# Patient Record
Sex: Female | Born: 1965 | Race: White | Hispanic: No | Marital: Married | Smoking: Current every day smoker
Health system: Southern US, Community
[De-identification: ages and names within clinical notes are randomized; demographics above are authoritative.]

## PROBLEM LIST (undated history)

## (undated) DIAGNOSIS — N83202 Unspecified ovarian cyst, left side: Secondary | ICD-10-CM

## (undated) DIAGNOSIS — K219 Gastro-esophageal reflux disease without esophagitis: Secondary | ICD-10-CM

## (undated) DIAGNOSIS — I7300 Raynaud's syndrome without gangrene: Secondary | ICD-10-CM

## (undated) DIAGNOSIS — E7800 Pure hypercholesterolemia, unspecified: Secondary | ICD-10-CM

## (undated) DIAGNOSIS — F419 Anxiety disorder, unspecified: Secondary | ICD-10-CM

## (undated) DIAGNOSIS — L4050 Arthropathic psoriasis, unspecified: Secondary | ICD-10-CM

## (undated) DIAGNOSIS — D0472 Carcinoma in situ of skin of left lower limb, including hip: Secondary | ICD-10-CM

## (undated) DIAGNOSIS — E559 Vitamin D deficiency, unspecified: Secondary | ICD-10-CM

## (undated) DIAGNOSIS — G5600 Carpal tunnel syndrome, unspecified upper limb: Secondary | ICD-10-CM

## (undated) DIAGNOSIS — D219 Benign neoplasm of connective and other soft tissue, unspecified: Secondary | ICD-10-CM

## (undated) HISTORY — PX: CYSTECTOMY: SUR359

## (undated) HISTORY — DX: Arthropathic psoriasis, unspecified: L40.50

## (undated) HISTORY — DX: Raynaud's syndrome without gangrene: I73.00

## (undated) HISTORY — PX: TONSILLECTOMY: SUR1361

## (undated) HISTORY — PX: ANKLE FRACTURE SURGERY: SHX122

## (undated) HISTORY — DX: Vitamin D deficiency, unspecified: E55.9

## (undated) HISTORY — PX: APPENDECTOMY: SHX54

## (undated) HISTORY — DX: Carcinoma in situ of skin of left lower limb, including hip: D04.72

---

## 1999-02-20 ENCOUNTER — Emergency Department (HOSPITAL_COMMUNITY): Admission: EM | Admit: 1999-02-20 | Discharge: 1999-02-20 | Payer: Self-pay | Admitting: Emergency Medicine

## 1999-03-11 ENCOUNTER — Encounter: Admission: RE | Admit: 1999-03-11 | Discharge: 1999-04-14 | Payer: Self-pay | Admitting: Orthopedic Surgery

## 2001-07-16 ENCOUNTER — Ambulatory Visit (HOSPITAL_COMMUNITY): Admission: RE | Admit: 2001-07-16 | Discharge: 2001-07-16 | Payer: Self-pay | Admitting: Neurosurgery

## 2001-07-16 ENCOUNTER — Encounter: Payer: Self-pay | Admitting: Neurosurgery

## 2003-01-17 ENCOUNTER — Encounter: Payer: Self-pay | Admitting: Chiropractic Medicine

## 2003-01-17 ENCOUNTER — Encounter: Admission: RE | Admit: 2003-01-17 | Discharge: 2003-01-17 | Payer: Self-pay | Admitting: Chiropractic Medicine

## 2003-07-21 ENCOUNTER — Encounter: Admission: RE | Admit: 2003-07-21 | Discharge: 2003-07-21 | Payer: Self-pay | Admitting: Chiropractic Medicine

## 2003-11-18 ENCOUNTER — Other Ambulatory Visit: Payer: Self-pay

## 2005-07-24 ENCOUNTER — Encounter: Admission: RE | Admit: 2005-07-24 | Discharge: 2005-07-24 | Payer: Self-pay | Admitting: Neurology

## 2005-07-25 ENCOUNTER — Encounter: Admission: RE | Admit: 2005-07-25 | Discharge: 2005-07-25 | Payer: Self-pay | Admitting: Neurology

## 2006-12-08 ENCOUNTER — Ambulatory Visit: Payer: Self-pay | Admitting: Urology

## 2010-03-05 ENCOUNTER — Emergency Department: Payer: Self-pay | Admitting: Internal Medicine

## 2010-05-23 HISTORY — PX: LAPAROSCOPIC HYSTERECTOMY: SHX1926

## 2011-03-02 ENCOUNTER — Other Ambulatory Visit: Payer: Self-pay | Admitting: Obstetrics and Gynecology

## 2011-03-02 ENCOUNTER — Inpatient Hospital Stay (HOSPITAL_COMMUNITY)
Admission: AD | Admit: 2011-03-02 | Discharge: 2011-03-02 | Disposition: A | Discharge status: Home / Self Care | Payer: BC Managed Care – PPO | Source: Ambulatory Visit | Attending: Obstetrics and Gynecology | Admitting: Obstetrics and Gynecology

## 2011-03-02 ENCOUNTER — Encounter (HOSPITAL_COMMUNITY): Payer: Self-pay

## 2011-03-02 DIAGNOSIS — N83209 Unspecified ovarian cyst, unspecified side: Secondary | ICD-10-CM | POA: Insufficient documentation

## 2011-03-02 DIAGNOSIS — D259 Leiomyoma of uterus, unspecified: Secondary | ICD-10-CM | POA: Insufficient documentation

## 2011-03-02 DIAGNOSIS — R109 Unspecified abdominal pain: Secondary | ICD-10-CM | POA: Insufficient documentation

## 2011-03-02 DIAGNOSIS — N926 Irregular menstruation, unspecified: Secondary | ICD-10-CM | POA: Insufficient documentation

## 2011-03-02 HISTORY — DX: Anxiety disorder, unspecified: F41.9

## 2011-03-02 HISTORY — DX: Benign neoplasm of connective and other soft tissue, unspecified: D21.9

## 2011-03-02 HISTORY — DX: Unspecified ovarian cyst, left side: N83.202

## 2011-03-02 LAB — URINALYSIS, ROUTINE W REFLEX MICROSCOPIC
Bilirubin Urine: NEGATIVE
Glucose, UA: NEGATIVE mg/dL
Ketones, ur: NEGATIVE mg/dL
Leukocytes, UA: NEGATIVE
Nitrite: NEGATIVE
Protein, ur: NEGATIVE mg/dL
Specific Gravity, Urine: 1.01 (ref 1.005–1.030)
Urobilinogen, UA: 0.2 mg/dL (ref 0.0–1.0)
pH: 6 (ref 5.0–8.0)

## 2011-03-02 LAB — URINE MICROSCOPIC-ADD ON

## 2011-03-02 MED ORDER — HYDROMORPHONE HCL 2 MG PO TABS
2.0000 mg | ORAL_TABLET | ORAL | Status: DC | PRN
  Administered 2011-03-02: 2 mg via ORAL
  Filled 2011-03-02: qty 1

## 2011-03-02 MED ORDER — HYDROMORPHONE HCL 2 MG PO TABS: 2.0000 mg | ORAL_TABLET | ORAL | Status: AC | PRN

## 2011-03-02 NOTE — Patient Instructions (Addendum)
   Your procedure is scheduled on: Thursday, Oct. 25, 2012 at 1:15pm  Enter through the Main Entrance of [facility] at: 11:45am Pick up the phone at the desk and dial [phone] and inform us of your arrival.  Please call this number if you have any problems the morning of surgery: [phone]  Remember: Do not eat food after midnight: Wednesday Do not drink clear liquids after: 9:00am Thursday Take these medicines the morning of surgery with a SIP OF WATER:zoloft, bring inhaler  Do not wear jewelry, make-up, or FINGER nail polish Do not wear lotions, powders, or perfumes.  . Do not shave 48 hours prior to surgery. Do not bring valuables to the hospital.  Leave suitcase in the car. After Surgery it may be brought to your room. For patients being admitted to the hospital, checkout time is 11:00am the day of discharge.   Remember to use your hibiclens as instructed.Please shower with 1/2 bottle the evening before your surgery and the other 1/2 bottle the morning of surgery.

## 2011-03-02 NOTE — Progress Notes (Signed)
Pt states she is scheduled for a LAVH by Dr. Cherly Hensen on 10-25. Has been having bleeding for 14 days and now having a lot of cramping.

## 2011-03-02 NOTE — Progress Notes (Signed)
Pt states lower abd pain bilaterally, has been hurting for weeks, hysterectomy scheduled for 03/17/2011. Awoke last pm with "blast" of clots, long and narrow, dark blood, small amt bleeding, vaginal d/c clear-mucousy, non-odorous.

## 2011-03-02 NOTE — ED Notes (Signed)
No adverse effect from dilaudid

## 2011-03-02 NOTE — ED Provider Notes (Signed)
History     Chief Complaint  Patient presents with  . Abdominal Pain   HPI  Scheduled for Davinci hysterectomy end of the month. States pain is miserable since last night - Vicodin ineffective for pain management. Reports heavy bleeding last night passing large clots - still bleeding this am. Pain constant all night - took 1 Vicodin last night without relief. Wants to see if surgery can be done sooner than end of the month - Dr Cherly Hensen aware of this request / will move up surgery if OR time becomes available.   Past Medical History  Diagnosis Date  . Anxiety   . Ovarian cyst, left   . Fibroids   . Asthma     Past Surgical History  Procedure Date  . Tonsillectomy   . Cystectomy   . Ankle fracture surgery   . Appendectomy     No family history on file.  History  Substance Use Topics  . Smoking status: Current Everyday Smoker -- 0.2 packs/day  . Smokeless tobacco: Never Used  . Alcohol Use: No    Allergies:  Allergies  Allergen Reactions  . Codeine Other (See Comments)    Reaction: causes accelerated heart rate   . Demerol Other (See Comments)    Reaction: causes accelerated heart rate     Prescriptions prior to admission  Medication Sig Dispense Refill  . aspirin 81 MG tablet Take 81 mg by mouth daily.        Marland Kitchen ezetimibe-simvastatin (VYTORIN) 10-40 MG per tablet Take 1 tablet by mouth daily.        . sertraline (ZOLOFT) 100 MG tablet Take 100 mg by mouth daily.        Marland Kitchen zolpidem (AMBIEN) 10 MG tablet Take 10 mg by mouth at bedtime.         ROS Physical Exam   Blood pressure 125/70, pulse 113, temperature 97.7 F (36.5 C), temperature source Oral, resp. rate 20, height 5\' 6"  (1.676 m), weight 84.823 kg (187 lb), last menstrual period 02/21/2011, SpO2 97.00%.  Physical Exam  NAD , talking with spouse at bedside Lungs - clear Heart - RRR Abdomen - soft / upper quadrants non-tender / uterus tender with palpation / no guarding or rebound Ext - no  edema  Spec exam - scant tan discharge / cervix closed no active bleeding visible Bimanual - no bladder tenderness / uterus tender and enlarged / adnexa full but non-tender  MAU Course  Procedures  Assessment and Plan  Abdominal pain and irregular bleeding - fibroids and ovarian cyst (Left) Scheduled for hysterectomy end of the month  Stable status - no evidence of heavy bleeding RX dilaudid for pain Keep apt with Dr Cherly Hensen in office next week - call to be seen in office by Dr Cherly Hensen if new pain medication ineffective in pain control. Hydrate well with fibroid uterus  Madalena Kesecker 03/02/2011, 10:54 AM

## 2011-03-02 NOTE — ED Notes (Addendum)
Dr. Cherly Hensen notified pt in MAU for pain and bleeding, hx of abnormal bleeding/pain, pt scheduled for hysterectomy 03/10/2011, Dr. Cherly Hensen to send CNM to assess pt in MAU. No orders obtained. Written erroneously under JLowe, RN.

## 2011-03-02 NOTE — ED Provider Notes (Signed)
Patricia Shields is a 45 y.o. female who presents to MAU for heavy bleeding and lower abdominal pain. She has been evaluated in the office by Dr. Cherly Hensen and is scheduled for hysterectomy. Last night the bleeding became heavy with clots, this am bleeding is less but pain continues in lower abdomen. The history was provided by the patient.  VS - T 97.7, P 113, R 20, O2Sat  97% on RA. Patient is stable to await private MD for further evaluation.  Thornton, NP 03/02/11 1031

## 2011-03-03 MED ORDER — ACETAMINOPHEN 10 MG/ML IV SOLN: 1000.0000 mg | Freq: Four times a day (QID) | INTRAVENOUS | Status: AC

## 2011-03-08 ENCOUNTER — Encounter (HOSPITAL_COMMUNITY)
Admission: RE | Admit: 2011-03-08 | Discharge: 2011-03-08 | Disposition: A | Discharge status: Home / Self Care | Payer: BC Managed Care – PPO | Source: Ambulatory Visit | Attending: Obstetrics and Gynecology | Admitting: Obstetrics and Gynecology

## 2011-03-08 ENCOUNTER — Encounter (HOSPITAL_COMMUNITY): Payer: Self-pay

## 2011-03-08 HISTORY — DX: Pure hypercholesterolemia, unspecified: E78.00

## 2011-03-08 HISTORY — DX: Gastro-esophageal reflux disease without esophagitis: K21.9

## 2011-03-08 HISTORY — DX: Carpal tunnel syndrome, unspecified upper limb: G56.00

## 2011-03-08 LAB — BASIC METABOLIC PANEL
BUN: 7 mg/dL (ref 6–23)
CO2: 28 mEq/L (ref 19–32)
Calcium: 8.9 mg/dL (ref 8.4–10.5)
Chloride: 103 mEq/L (ref 96–112)
Creatinine, Ser: <0.47 mg/dL — ABNORMAL LOW (ref 0.50–1.10)
Glucose, Bld: 98 mg/dL (ref 70–99)
Potassium: 3.6 mEq/L (ref 3.5–5.1)
Sodium: 137 mEq/L (ref 135–145)

## 2011-03-08 LAB — SURGICAL PCR SCREEN
MRSA, PCR: NEGATIVE
Staphylococcus aureus: NEGATIVE

## 2011-03-08 LAB — CBC
HCT: 34.8 % — ABNORMAL LOW (ref 36.0–46.0)
Hemoglobin: 11.3 g/dL — ABNORMAL LOW (ref 12.0–15.0)
MCH: 30.6 pg (ref 26.0–34.0)
MCHC: 32.5 g/dL (ref 30.0–36.0)
MCV: 94.3 fL (ref 78.0–100.0)
Platelets: 267 10*3/uL (ref 150–400)
RBC: 3.69 MIL/uL — ABNORMAL LOW (ref 3.87–5.11)
RDW: 13.2 % (ref 11.5–15.5)
WBC: 6.5 10*3/uL (ref 4.0–10.5)

## 2011-03-16 MED ORDER — CEFAZOLIN SODIUM-DEXTROSE 2-3 GM-% IV SOLR
2.0000 g | INTRAVENOUS | Status: AC
  Administered 2011-03-17: 2 g via INTRAVENOUS
  Filled 2011-03-16: qty 50

## 2011-03-17 ENCOUNTER — Encounter (HOSPITAL_COMMUNITY): Payer: Self-pay | Admitting: Anesthesiology

## 2011-03-17 ENCOUNTER — Ambulatory Visit (HOSPITAL_COMMUNITY): Payer: BC Managed Care – PPO | Admitting: Anesthesiology

## 2011-03-17 ENCOUNTER — Encounter (HOSPITAL_COMMUNITY)
Admission: RE | Disposition: A | Discharge status: Home / Self Care | Payer: Self-pay | Source: Ambulatory Visit | Attending: Obstetrics and Gynecology

## 2011-03-17 ENCOUNTER — Encounter (HOSPITAL_COMMUNITY): Payer: Self-pay | Admitting: *Deleted

## 2011-03-17 ENCOUNTER — Ambulatory Visit (HOSPITAL_COMMUNITY)
Admission: RE | Admit: 2011-03-17 | Discharge: 2011-03-18 | Disposition: A | Discharge status: Home / Self Care | Payer: BC Managed Care – PPO | Source: Ambulatory Visit | Attending: Obstetrics and Gynecology | Admitting: Obstetrics and Gynecology

## 2011-03-17 DIAGNOSIS — N801 Endometriosis of ovary: Secondary | ICD-10-CM | POA: Insufficient documentation

## 2011-03-17 DIAGNOSIS — N803 Endometriosis of pelvic peritoneum, unspecified: Secondary | ICD-10-CM | POA: Insufficient documentation

## 2011-03-17 DIAGNOSIS — N949 Unspecified condition associated with female genital organs and menstrual cycle: Secondary | ICD-10-CM | POA: Insufficient documentation

## 2011-03-17 DIAGNOSIS — Z90721 Acquired absence of ovaries, unilateral: Secondary | ICD-10-CM

## 2011-03-17 DIAGNOSIS — N938 Other specified abnormal uterine and vaginal bleeding: Secondary | ICD-10-CM | POA: Insufficient documentation

## 2011-03-17 DIAGNOSIS — Z90710 Acquired absence of both cervix and uterus: Secondary | ICD-10-CM

## 2011-03-17 DIAGNOSIS — N83209 Unspecified ovarian cyst, unspecified side: Secondary | ICD-10-CM | POA: Insufficient documentation

## 2011-03-17 DIAGNOSIS — Z01812 Encounter for preprocedural laboratory examination: Secondary | ICD-10-CM | POA: Insufficient documentation

## 2011-03-17 DIAGNOSIS — N80109 Endometriosis of ovary, unspecified side, unspecified depth: Secondary | ICD-10-CM | POA: Insufficient documentation

## 2011-03-17 DIAGNOSIS — D251 Intramural leiomyoma of uterus: Secondary | ICD-10-CM | POA: Insufficient documentation

## 2011-03-17 DIAGNOSIS — Z01818 Encounter for other preprocedural examination: Secondary | ICD-10-CM | POA: Insufficient documentation

## 2011-03-17 HISTORY — PX: SALPINGOOPHORECTOMY: SHX82

## 2011-03-17 LAB — PREGNANCY, URINE: Preg Test, Ur: NEGATIVE

## 2011-03-17 SURGERY — ROBOTIC ASSISTED TOTAL HYSTERECTOMY WITH BILATERAL SALPINGO OOPHORECTOMY
Anesthesia: General | Wound class: Clean Contaminated

## 2011-03-17 MED ORDER — OXYCODONE-ACETAMINOPHEN 5-325 MG PO TABS
1.0000 | ORAL_TABLET | ORAL | Status: DC | PRN
  Administered 2011-03-18: 2 via ORAL
  Filled 2011-03-17: qty 2

## 2011-03-17 MED ORDER — DIPHENHYDRAMINE HCL 50 MG/ML IJ SOLN: 12.5000 mg | Freq: Four times a day (QID) | INTRAMUSCULAR | Status: DC | PRN

## 2011-03-17 MED ORDER — SUCCINYLCHOLINE CHLORIDE 20 MG/ML IJ SOLN
INTRAMUSCULAR | Status: AC
  Filled 2011-03-17: qty 1

## 2011-03-17 MED ORDER — ROCURONIUM BROMIDE 50 MG/5ML IV SOLN
INTRAVENOUS | Status: AC
  Filled 2011-03-17: qty 2

## 2011-03-17 MED ORDER — SODIUM CHLORIDE 0.9 % IJ SOLN: 9.0000 mL | INTRAMUSCULAR | Status: DC | PRN

## 2011-03-17 MED ORDER — LACTATED RINGERS IR SOLN
Status: DC | PRN
  Administered 2011-03-17: 1

## 2011-03-17 MED ORDER — BISACODYL 10 MG RE SUPP: 10.0000 mg | Freq: Every day | RECTAL | Status: DC | PRN

## 2011-03-17 MED ORDER — ONDANSETRON HCL 4 MG/2ML IJ SOLN: 4.0000 mg | Freq: Four times a day (QID) | INTRAMUSCULAR | Status: DC | PRN

## 2011-03-17 MED ORDER — CEFAZOLIN SODIUM 1-5 GM-% IV SOLN
1.0000 g | Freq: Three times a day (TID) | INTRAVENOUS | Status: AC
  Administered 2011-03-17 – 2011-03-18 (×2): 1 g via INTRAVENOUS
  Filled 2011-03-17 (×2): qty 50

## 2011-03-17 MED ORDER — DEXAMETHASONE SODIUM PHOSPHATE 10 MG/ML IJ SOLN
INTRAMUSCULAR | Status: DC | PRN
  Administered 2011-03-17: 10 mg via INTRAVENOUS

## 2011-03-17 MED ORDER — PANTOPRAZOLE SODIUM 40 MG PO TBEC
40.0000 mg | DELAYED_RELEASE_TABLET | Freq: Every day | ORAL | Status: DC
  Filled 2011-03-17: qty 1

## 2011-03-17 MED ORDER — ZOLPIDEM TARTRATE 5 MG PO TABS
5.0000 mg | ORAL_TABLET | Freq: Every evening | ORAL | Status: DC | PRN
  Administered 2011-03-17: 10 mg via ORAL
  Filled 2011-03-17: qty 2

## 2011-03-17 MED ORDER — HYDROMORPHONE HCL 1 MG/ML IJ SOLN
INTRAMUSCULAR | Status: DC | PRN
  Administered 2011-03-17: 0.5 mg via INTRAVENOUS
  Administered 2011-03-17: 1 mg via INTRAVENOUS
  Administered 2011-03-17: 0.5 mg via INTRAVENOUS

## 2011-03-17 MED ORDER — BUPIVACAINE HCL (PF) 0.25 % IJ SOLN
INTRAMUSCULAR | Status: DC | PRN
  Administered 2011-03-17: 7 mL

## 2011-03-17 MED ORDER — LIDOCAINE HCL (CARDIAC) 20 MG/ML IV SOLN
INTRAVENOUS | Status: DC | PRN
  Administered 2011-03-17: 80 mg via INTRAVENOUS

## 2011-03-17 MED ORDER — MIDAZOLAM HCL 2 MG/2ML IJ SOLN
INTRAMUSCULAR | Status: AC
  Filled 2011-03-17: qty 2

## 2011-03-17 MED ORDER — HYDROMORPHONE HCL 1 MG/ML IJ SOLN
INTRAMUSCULAR | Status: AC
  Filled 2011-03-17: qty 1

## 2011-03-17 MED ORDER — DEXAMETHASONE SODIUM PHOSPHATE 10 MG/ML IJ SOLN
INTRAMUSCULAR | Status: AC
  Filled 2011-03-17: qty 1

## 2011-03-17 MED ORDER — SUCCINYLCHOLINE CHLORIDE 20 MG/ML IJ SOLN
INTRAMUSCULAR | Status: DC | PRN
  Administered 2011-03-17: 100 mg via INTRAVENOUS

## 2011-03-17 MED ORDER — HYDROMORPHONE HCL 1 MG/ML IJ SOLN
INTRAMUSCULAR | Status: AC
  Administered 2011-03-17: 0.25 mg via INTRAVENOUS
  Filled 2011-03-17: qty 1

## 2011-03-17 MED ORDER — SENNOSIDES-DOCUSATE SODIUM 8.6-50 MG PO TABS: 2.0000 | ORAL_TABLET | Freq: Every day | ORAL | Status: DC | PRN

## 2011-03-17 MED ORDER — GLYCOPYRROLATE 0.2 MG/ML IJ SOLN
INTRAMUSCULAR | Status: DC | PRN
  Administered 2011-03-17: .8 mg via INTRAVENOUS

## 2011-03-17 MED ORDER — MIDAZOLAM HCL 5 MG/5ML IJ SOLN
INTRAMUSCULAR | Status: DC | PRN
  Administered 2011-03-17 (×2): 1 mg via INTRAVENOUS

## 2011-03-17 MED ORDER — EZETIMIBE 10 MG PO TABS
10.0000 mg | ORAL_TABLET | Freq: Every day | ORAL | Status: DC
  Filled 2011-03-17: qty 1

## 2011-03-17 MED ORDER — DOCUSATE SODIUM 100 MG PO CAPS: 100.0000 mg | ORAL_CAPSULE | Freq: Every day | ORAL | Status: DC

## 2011-03-17 MED ORDER — DEXTROSE IN LACTATED RINGERS 5 % IV SOLN
INTRAVENOUS | Status: DC
  Administered 2011-03-17 – 2011-03-18 (×2): via INTRAVENOUS

## 2011-03-17 MED ORDER — PROPOFOL 10 MG/ML IV EMUL
INTRAVENOUS | Status: DC | PRN
  Administered 2011-03-17: 150 mg via INTRAVENOUS
  Administered 2011-03-17: 30 mg via INTRAVENOUS
  Administered 2011-03-17: 50 mg via INTRAVENOUS

## 2011-03-17 MED ORDER — SIMVASTATIN 40 MG PO TABS
40.0000 mg | ORAL_TABLET | Freq: Every day | ORAL | Status: DC
  Filled 2011-03-17: qty 1

## 2011-03-17 MED ORDER — FENTANYL CITRATE 0.05 MG/ML IJ SOLN
INTRAMUSCULAR | Status: AC
  Filled 2011-03-17: qty 5

## 2011-03-17 MED ORDER — LIDOCAINE HCL (CARDIAC) 20 MG/ML IV SOLN
INTRAVENOUS | Status: AC
  Filled 2011-03-17: qty 5

## 2011-03-17 MED ORDER — SERTRALINE HCL 100 MG PO TABS
100.0000 mg | ORAL_TABLET | Freq: Every day | ORAL | Status: DC
  Filled 2011-03-17: qty 1

## 2011-03-17 MED ORDER — ACETAMINOPHEN 10 MG/ML IV SOLN
1000.0000 mg | Freq: Four times a day (QID) | INTRAVENOUS | Status: DC
  Filled 2011-03-17 (×4): qty 100

## 2011-03-17 MED ORDER — KETOROLAC TROMETHAMINE 30 MG/ML IJ SOLN: 30.0000 mg | Freq: Four times a day (QID) | INTRAMUSCULAR | Status: DC

## 2011-03-17 MED ORDER — ALUM & MAG HYDROXIDE-SIMETH 200-200-20 MG/5ML PO SUSP: 30.0000 mL | ORAL | Status: DC | PRN

## 2011-03-17 MED ORDER — ONDANSETRON HCL 4 MG/2ML IJ SOLN
INTRAMUSCULAR | Status: DC | PRN
  Administered 2011-03-17: 4 mg via INTRAVENOUS

## 2011-03-17 MED ORDER — LACTATED RINGERS IV SOLN
INTRAVENOUS | Status: DC
  Administered 2011-03-17 (×2): via INTRAVENOUS

## 2011-03-17 MED ORDER — HYDROMORPHONE 0.3 MG/ML IV SOLN
INTRAVENOUS | Status: AC
  Filled 2011-03-17: qty 25

## 2011-03-17 MED ORDER — NEOSTIGMINE METHYLSULFATE 1 MG/ML IJ SOLN
INTRAMUSCULAR | Status: AC
  Filled 2011-03-17: qty 10

## 2011-03-17 MED ORDER — FENTANYL CITRATE 0.05 MG/ML IJ SOLN
INTRAMUSCULAR | Status: DC | PRN
  Administered 2011-03-17: 50 ug via INTRAVENOUS
  Administered 2011-03-17: 100 ug via INTRAVENOUS
  Administered 2011-03-17 (×2): 50 ug via INTRAVENOUS

## 2011-03-17 MED ORDER — IBUPROFEN 800 MG PO TABS: 800.0000 mg | ORAL_TABLET | Freq: Three times a day (TID) | ORAL | Status: DC | PRN

## 2011-03-17 MED ORDER — KETOROLAC TROMETHAMINE 30 MG/ML IJ SOLN
30.0000 mg | Freq: Four times a day (QID) | INTRAMUSCULAR | Status: DC
  Administered 2011-03-17 – 2011-03-18 (×2): 30 mg via INTRAVENOUS
  Filled 2011-03-17 (×2): qty 1

## 2011-03-17 MED ORDER — ONDANSETRON HCL 4 MG PO TABS: 4.0000 mg | ORAL_TABLET | Freq: Four times a day (QID) | ORAL | Status: DC | PRN

## 2011-03-17 MED ORDER — NEOSTIGMINE METHYLSULFATE 1 MG/ML IJ SOLN
INTRAMUSCULAR | Status: DC | PRN
  Administered 2011-03-17: 4 mg via INTRAVENOUS

## 2011-03-17 MED ORDER — ROCURONIUM BROMIDE 100 MG/10ML IV SOLN
INTRAVENOUS | Status: DC | PRN
  Administered 2011-03-17: 40 mg via INTRAVENOUS
  Administered 2011-03-17: 10 mg via INTRAVENOUS
  Administered 2011-03-17 (×2): 20 mg via INTRAVENOUS

## 2011-03-17 MED ORDER — HYDROMORPHONE HCL 1 MG/ML IJ SOLN
0.2000 mg | INTRAMUSCULAR | Status: DC | PRN
Start: 2011-03-17 — End: 2011-03-18

## 2011-03-17 MED ORDER — HYDROMORPHONE HCL 1 MG/ML IJ SOLN
0.2500 mg | INTRAMUSCULAR | Status: DC | PRN
  Administered 2011-03-17 (×2): 0.25 mg via INTRAVENOUS

## 2011-03-17 MED ORDER — DEXAMETHASONE SODIUM PHOSPHATE 4 MG/ML IJ SOLN: 8.0000 mg | Freq: Once | INTRAMUSCULAR | Status: DC | PRN

## 2011-03-17 MED ORDER — PROPOFOL 10 MG/ML IV EMUL
INTRAVENOUS | Status: AC
  Filled 2011-03-17: qty 20

## 2011-03-17 MED ORDER — EZETIMIBE-SIMVASTATIN 10-40 MG PO TABS: 1.0000 | ORAL_TABLET | Freq: Every day | ORAL | Status: DC

## 2011-03-17 MED ORDER — GLYCOPYRROLATE 0.2 MG/ML IJ SOLN
INTRAMUSCULAR | Status: AC
  Filled 2011-03-17: qty 3

## 2011-03-17 MED ORDER — KETOROLAC TROMETHAMINE 30 MG/ML IJ SOLN: 15.0000 mg | Freq: Once | INTRAMUSCULAR | Status: DC | PRN

## 2011-03-17 MED ORDER — MENTHOL 3 MG MT LOZG
1.0000 | LOZENGE | OROMUCOSAL | Status: DC | PRN
  Administered 2011-03-17: 3 mg via ORAL
  Filled 2011-03-17: qty 9

## 2011-03-17 MED ORDER — HYDROMORPHONE 0.3 MG/ML IV SOLN
INTRAVENOUS | Status: DC
  Administered 2011-03-17: 22:00:00 via INTRAVENOUS
  Administered 2011-03-18: 0.19 mg via INTRAVENOUS
  Administered 2011-03-18: 0.4 mg via INTRAVENOUS
  Administered 2011-03-18: 0.999 mg via INTRAVENOUS

## 2011-03-17 MED ORDER — NALOXONE HCL 0.4 MG/ML IJ SOLN: 0.4000 mg | INTRAMUSCULAR | Status: DC | PRN

## 2011-03-17 MED ORDER — DIPHENHYDRAMINE HCL 12.5 MG/5ML PO ELIX: 12.5000 mg | ORAL_SOLUTION | Freq: Four times a day (QID) | ORAL | Status: DC | PRN

## 2011-03-17 MED ORDER — SIMETHICONE 80 MG PO CHEW: 80.0000 mg | CHEWABLE_TABLET | Freq: Four times a day (QID) | ORAL | Status: DC | PRN

## 2011-03-17 MED ORDER — ACETAMINOPHEN 10 MG/ML IV SOLN
INTRAVENOUS | Status: DC | PRN
  Administered 2011-03-17: 1000 mg via INTRAVENOUS

## 2011-03-17 MED ORDER — KETOROLAC TROMETHAMINE 30 MG/ML IJ SOLN
INTRAMUSCULAR | Status: DC | PRN
  Administered 2011-03-17: 30 mg via INTRAVENOUS

## 2011-03-17 MED ORDER — ONDANSETRON HCL 4 MG/2ML IJ SOLN
INTRAMUSCULAR | Status: AC
  Filled 2011-03-17: qty 2

## 2011-03-17 SURGICAL SUPPLY — 57 items
BAG URINE DRAINAGE (UROLOGICAL SUPPLIES) ×3 IMPLANT
BARRIER ADHS 3X4 INTERCEED (GAUZE/BANDAGES/DRESSINGS) ×3 IMPLANT
BLADE LAP MORCELLATOR 15X9.5 (ELECTROSURGICAL) ×3 IMPLANT
BLADELESS LONG 8MM (BLADE) ×3 IMPLANT
CABLE HIGH FREQUENCY MONO STRZ (ELECTRODE) ×3 IMPLANT
CATH FOLEY 3WAY  5CC 16FR (CATHETERS) ×1
CATH FOLEY 3WAY 5CC 16FR (CATHETERS) ×2 IMPLANT
CHLORAPREP W/TINT 26ML (MISCELLANEOUS) ×3 IMPLANT
CONT PATH 16OZ SNAP LID 3702 (MISCELLANEOUS) ×3 IMPLANT
COVER MAYO STAND STRL (DRAPES) ×3 IMPLANT
COVER TABLE BACK 60X90 (DRAPES) ×6 IMPLANT
COVER TIP SHEARS 8 DVNC (MISCELLANEOUS) ×2 IMPLANT
COVER TIP SHEARS 8MM DA VINCI (MISCELLANEOUS) ×1
DECANTER SPIKE VIAL GLASS SM (MISCELLANEOUS) ×3 IMPLANT
DERMABOND ADVANCED (GAUZE/BANDAGES/DRESSINGS) ×1
DERMABOND ADVANCED .7 DNX12 (GAUZE/BANDAGES/DRESSINGS) ×2 IMPLANT
DRAPE HUG U DISPOSABLE (DRAPE) ×3 IMPLANT
DRAPE LG THREE QUARTER DISP (DRAPES) ×6 IMPLANT
DRAPE MONITOR DA VINCI (DRAPE) ×3 IMPLANT
DRAPE WARM FLUID 44X44 (DRAPE) ×3 IMPLANT
ELECT REM PT RETURN 9FT ADLT (ELECTROSURGICAL) ×3
ELECTRODE REM PT RTRN 9FT ADLT (ELECTROSURGICAL) ×2 IMPLANT
EVACUATOR SMOKE 8.L (FILTER) ×3 IMPLANT
GAUZE VASELINE 3X9 (GAUZE/BANDAGES/DRESSINGS) IMPLANT
GLOVE BIO SURGEON STRL SZ 6.5 (GLOVE) ×6 IMPLANT
GLOVE BIOGEL PI IND STRL 7.0 (GLOVE) ×6 IMPLANT
GLOVE BIOGEL PI INDICATOR 7.0 (GLOVE) ×3
GOWN PREVENTION PLUS LG XLONG (DISPOSABLE) ×12 IMPLANT
KIT DISP ACCESSORY 4 ARM (KITS) ×3 IMPLANT
NEEDLE INSUFFLATION 14GA 120MM (NEEDLE) ×3 IMPLANT
NS IRRIG 1000ML POUR BTL (IV SOLUTION) ×9 IMPLANT
OCCLUDER COLPOPNEUMO (BALLOONS) ×3 IMPLANT
PACK LAVH (CUSTOM PROCEDURE TRAY) ×3 IMPLANT
PAD PREP 24X48 CUFFED NSTRL (MISCELLANEOUS) ×6 IMPLANT
PLUG CATH AND CAP STER (CATHETERS) ×3 IMPLANT
SCISSORS LAP 5X35 DISP (ENDOMECHANICALS) IMPLANT
SET IRRIG TUBING LAPAROSCOPIC (IRRIGATION / IRRIGATOR) ×3 IMPLANT
SOLUTION ELECTROLUBE (MISCELLANEOUS) ×3 IMPLANT
SPONGE LAP 18X18 X RAY DECT (DISPOSABLE) IMPLANT
SUT VIC AB 0 CT1 27 (SUTURE) ×7
SUT VIC AB 0 CT1 27XBRD ANTBC (SUTURE) ×14 IMPLANT
SUT VICRYL 0 UR6 27IN ABS (SUTURE) ×3 IMPLANT
SUT VICRYL 4-0 PS2 18IN ABS (SUTURE) ×6 IMPLANT
SYR 50ML LL SCALE MARK (SYRINGE) ×3 IMPLANT
SYSTEM CONVERTIBLE TROCAR (TROCAR) IMPLANT
TIP UTERINE 5.1X6CM LAV DISP (MISCELLANEOUS) ×6 IMPLANT
TIP UTERINE 6.7X10CM GRN DISP (MISCELLANEOUS) IMPLANT
TIP UTERINE 6.7X6CM WHT DISP (MISCELLANEOUS) IMPLANT
TIP UTERINE 6.7X8CM BLUE DISP (MISCELLANEOUS) IMPLANT
TOWEL OR 17X24 6PK STRL BLUE (TOWEL DISPOSABLE) ×9 IMPLANT
TROCAR 12M 150ML BLUNT (TROCAR) IMPLANT
TROCAR DISP BLADELESS 8 DVNC (TROCAR) IMPLANT
TROCAR DISP BLADELESS 8MM (TROCAR)
TROCAR Z-THREAD 12X150 (TROCAR) ×3 IMPLANT
TROCAR Z-THREAD BLADED 12X100M (TROCAR) ×3 IMPLANT
TUBING FILTER THERMOFLATOR (ELECTROSURGICAL) ×3 IMPLANT
WARMER LAPAROSCOPE (MISCELLANEOUS) ×3 IMPLANT

## 2011-03-17 NOTE — Brief Op Note (Signed)
03/17/2011  6:06 PM  PATIENT:  Patricia Shields  45 y.o. female  PRE-OPERATIVE DIAGNOSIS:  Left Complex Ovarian Cyst; Dysfunctional Uterine Bleeding, Pelvic Pain  POST-OPERATIVE DIAGNOSIS:  Left Complex Ovarian Cyst; Dysfunctional Uterine Bleeding, Pelvic Pain, Left endometrioma, Uterine fibroids  PROCEDURE:  Procedure(s):Da Vincii ROBOTIC ASSISTED TOTAL HYSTERECTOMY WITH  LEFT SALPINGO OOPHERECTOMY   SURGEON:  Surgeon(s): Hubert Raatz Cathie Beams, MD Alfredia Ferguson Mody  PHYSICIAN ASSISTANT:   ASSISTANTS: none  ANESTHESIA:   general Findings: Fibroid uterus, nl right tube and ovary, left ovarian endometrioma attached to left ovarian fossa , nl liver edge, EBL:  Total I/O In: 1000 [I.V.:1000] Out: 450 [Urine:400; Blood:50]  BLOOD ADMINISTERED:none  DRAINS: none   LOCAL MEDICATIONS USED:  MARCAINE 7CC  SPECIMEN:  Source of Specimen:  uterus with cervix, left tube and ovary  DISPOSITION OF SPECIMEN:  PATHOLOGY  COUNTS:  YES  TOURNIQUET:  * No tourniquets in log *  DICTATION: .Other Dictation: Dictation Number  [id number]  PLAN OF CARE: Admit for overnight observation  PATIENT DISPOSITION:  PACU - guarded condition.   Delay start of Pharmacological VTE agent (>24hrs) due to surgical blood loss or risk of bleeding:  no

## 2011-03-17 NOTE — Anesthesia Postprocedure Evaluation (Signed)
  Anesthesia Post-op Note  Patient: Patricia Shields  Procedure(s) Performed:  ROBOTIC ASSISTED TOTAL HYSTERECTOMY WITH SALPINGO OOPHERECTOMY - Robotic Assisted Total Hysterectomy with Left Salpingo-Oophorectomy; SALPINGO OOPHERECTOMY  Patient is awake and responsive. Pain and nausea are reasonably well controlled. Vital signs are stable and clinically acceptable. Oxygen saturation is clinically acceptable. There are no apparent anesthetic complications at this time. Patient is ready for discharge.

## 2011-03-17 NOTE — Anesthesia Preprocedure Evaluation (Addendum)
Anesthesia Evaluation  Patient identified by MRN, date of birth, ID band Patient awake  General Assessment Comment  Reviewed: Allergy & Precautions, H&P , NPO status , Patient's Chart, lab work & pertinent test results  Airway Mallampati: I TM Distance: >3 FB Neck ROM: full    Dental No notable dental hx. (+) Teeth Intact   Pulmonary    Pulmonary exam normal       Cardiovascular     Neuro/Psych Negative Psych ROS   GI/Hepatic Neg liver ROS    Endo/Other  Negative Endocrine ROS  Renal/GU negative Renal ROS  Genitourinary negative   Musculoskeletal negative musculoskeletal ROS (+)   Abdominal Normal abdominal exam  (+)   Peds negative pediatric ROS (+)  Hematology negative hematology ROS (+)   Anesthesia Other Findings   Reproductive/Obstetrics negative OB ROS                           Anesthesia Physical Anesthesia Plan  ASA: II  Anesthesia Plan: General   Post-op Pain Management:    Induction: Intravenous  Airway Management Planned: Oral ETT  Additional Equipment:   Intra-op Plan:   Post-operative Plan: Extubation in OR  Informed Consent: I have reviewed the patients History and Physical, chart, labs and discussed the procedure including the risks, benefits and alternatives for the proposed anesthesia with the patient or authorized representative who has indicated his/her understanding and acceptance.   Dental Advisory Given  Plan Discussed with: CRNA  Anesthesia Plan Comments:         Anesthesia Quick Evaluation

## 2011-03-17 NOTE — Anesthesia Procedure Notes (Signed)
Procedure Name: Intubation Date/Time: 03/17/2011 2:39 PM Performed by: Karleen Dolphin Pre-anesthesia Checklist: Emergency Drugs available, Timeout performed, Patient identified, Patient being monitored and Suction available Patient Re-evaluated:Patient Re-evaluated prior to inductionOxygen Delivery Method: Circle System Utilized Preoxygenation: Pre-oxygenation with 100% oxygen Intubation Type: IV induction Ventilation: Mask ventilation without difficulty Laryngoscope Size: Mac and 3 Grade View: Grade I Tube type: Oral Tube size: 7.0 mm Number of attempts: 1 Airway Equipment and Method: stylet Placement Confirmation: ETT inserted through vocal cords under direct vision,  breath sounds checked- equal and bilateral and positive ETCO2 Secured at: 21 cm Tube secured with: Tape Dental Injury: Teeth and Oropharynx as per pre-operative assessment

## 2011-03-17 NOTE — Transfer of Care (Signed)
Immediate Anesthesia Transfer of Care Note  Patient: Patricia Shields  Procedure(s) Performed:  ROBOTIC ASSISTED TOTAL HYSTERECTOMY WITH SALPINGO OOPHERECTOMY - Robotic Assisted Total Hysterectomy with Left Salpingo-Oophorectomy; SALPINGO OOPHERECTOMY  Patient Location: PACU  Anesthesia Type: General  Level of Consciousness: awake, alert  and oriented  Airway & Oxygen Therapy: Patient Spontanous Breathing and Patient connected to nasal cannula oxygen  Post-op Assessment: Report given to PACU RN and Post -op Vital signs reviewed and stable  Post vital signs: Reviewed and stable  Complications: No apparent anesthesia complications and Patient re-intubated

## 2011-03-18 ENCOUNTER — Other Ambulatory Visit: Payer: Self-pay | Admitting: Obstetrics and Gynecology

## 2011-03-18 LAB — CBC
HCT: 31.2 % — ABNORMAL LOW (ref 36.0–46.0)
Hemoglobin: 10.1 g/dL — ABNORMAL LOW (ref 12.0–15.0)
MCH: 30.6 pg (ref 26.0–34.0)
MCHC: 32.4 g/dL (ref 30.0–36.0)
MCV: 94.5 fL (ref 78.0–100.0)
Platelets: 278 10*3/uL (ref 150–400)
RBC: 3.3 MIL/uL — ABNORMAL LOW (ref 3.87–5.11)
RDW: 13.3 % (ref 11.5–15.5)
WBC: 11.9 10*3/uL — ABNORMAL HIGH (ref 4.0–10.5)

## 2011-03-18 LAB — BASIC METABOLIC PANEL
BUN: 5 mg/dL — ABNORMAL LOW (ref 6–23)
CO2: 25 mEq/L (ref 19–32)
Calcium: 8.9 mg/dL (ref 8.4–10.5)
Chloride: 107 mEq/L (ref 96–112)
Creatinine, Ser: 0.5 mg/dL (ref 0.50–1.10)
GFR calc Af Amer: >90 mL/min (ref >90)
GFR calc non Af Amer: >90 mL/min (ref >90)
Glucose, Bld: 138 mg/dL — ABNORMAL HIGH (ref 70–99)
Potassium: 4 mEq/L (ref 3.5–5.1)
Sodium: 139 mEq/L (ref 135–145)

## 2011-03-18 MED ORDER — SIMETHICONE 80 MG PO CHEW: 80.0000 mg | CHEWABLE_TABLET | Freq: Four times a day (QID) | ORAL | Status: AC | PRN

## 2011-03-18 MED ORDER — OXYCODONE-ACETAMINOPHEN 5-325 MG PO TABS: 1.0000 | ORAL_TABLET | ORAL | Status: AC | PRN

## 2011-03-18 MED ORDER — DSS 100 MG PO CAPS: 100.0000 mg | ORAL_CAPSULE | Freq: Two times a day (BID) | ORAL | Status: AC

## 2011-03-18 NOTE — Progress Notes (Signed)
Pt discharged to home with husband.  Condition stable.  Pt to car via wheelchair with C. Riley, NT.  No equipment ordered for home at discharge. 

## 2011-03-18 NOTE — Discharge Summary (Signed)
Physician Discharge Summary  Patient ID: Patricia Shields MRN: [id number] DOB/AGE: 45-Jan-1967 45 y.o.  Admit date: 03/17/2011 Discharge date: 03/18/2011  Admission Diagnoses: DUB, left ovarian cyst, pelvic pain, uterine fibroids  Discharge Diagnoses: DUB, left endometrioma, uterine fibroids, pelvic pain Active Problems:  * No active hospital problems. *    Discharged Condition: Stable Hospital Course: Pt was admitted to [facility]. She was taken to the operating room where she underwent DaVinci TLH LSO. Uncomplicated postop course. Pt was passing flatus on POD #1, had voided w/o difficulty and pain controlled w/ med  Consults: none  Significant Diagnostic Studies: labs: Hgb 10.1 Hct 31.2 plt 278K wbc 11.9 Creatinine 0.50  Treatments: surgery: DaVinci TLH, LSO  Discharge Exam: Blood pressure 103/57, pulse 77, temperature 98.7 F (37.1 C), temperature source Oral, resp. rate 18, height 5\' 6"  (1.676 m), weight 183 lb (83.008 kg), SpO2 98.00%. General appearance: alert and cooperative Resp: clear to auscultation bilaterally Cardio: regular rate and rhythm, S1, S2 normal, no murmur, click, rub or gallop GI: soft, non-tender; bowel sounds normal; no masses,  no organomegaly Extremities: no edema, redness or tenderness in the calves or thighs Skin: Skin color, texture, turgor normal. No rashes or lesions Incision/Wound: sl erythema around umbilical and left upper site c/w  Early ecchymosis rather than infection no drainage or purulence noted  Disposition: Home or Self Care   Current Discharge Medication List    CONTINUE these medications which have NOT CHANGED   Details  sertraline (ZOLOFT) 100 MG tablet Take 100 mg by mouth daily.      aspirin 81 MG tablet Take 81 mg by mouth daily.      ezetimibe-simvastatin (VYTORIN) 10-40 MG per tablet Take 1 tablet by mouth daily.      zolpidem (AMBIEN) 10 MG tablet Take 10 mg by mouth at bedtime.          Signed: Demosthenes Virnig  A 03/18/2011, 8:02 AM

## 2011-03-18 NOTE — Anesthesia Postprocedure Evaluation (Signed)
  Anesthesia Post-op Note  Patient: Patricia Shields  Procedure(s) Performed:  ROBOTIC ASSISTED TOTAL HYSTERECTOMY WITH SALPINGO OOPHERECTOMY - Robotic Assisted Total Hysterectomy with Left Salpingo-Oophorectomy; SALPINGO OOPHERECTOMY  Patient Location: [facility] Unit  Anesthesia Type: General  Level of Consciousness: awake  Airway and Oxygen Therapy: Patient Spontanous Breathing  Post-op Pain: mild  Post-op Assessment: Patient's Cardiovascular Status Stable, Respiratory Function Stable, No signs of Nausea or vomiting, Adequate PO intake and Pain level controlled  Post-op Vital Signs: Reviewed and stable  Complications: No apparent anesthesia complications

## 2011-03-18 NOTE — Addendum Note (Signed)
Addendum  created 03/18/11 1610 by Suella Grove   Modules edited:Notes Section

## 2011-03-18 NOTE — Op Note (Signed)
NAME:  Patricia Shields, Patricia Shields NO.:  [id number]  MEDICAL RECORD NO.:  [id number]  LOCATION:  9311                          FACILITY:  [facility]  PHYSICIAN:  Maxie Better, M.D.DATE OF BIRTH:  June 02, 1965  DATE OF PROCEDURE:  03/17/2011 DATE OF DISCHARGE:                              OPERATIVE REPORT   PREOPERATIVE DIAGNOSES:  Left complex ovarian cyst, pelvic pain, dysfunctional uterine bleeding.  PROCEDURES:  Da Vinci total hysterectomy, left salpingo-oophorectomy.  POSTOPERATIVE DIAGNOSES:  Left ovarian cyst, pelvic pain, dysfunctional uterine bleeding, left ovarian endometrioma, pelvic endometriosis  ANESTHESIA:  General.  SURGEON:  Maxie Better, MD  ASSISTANT:  Darryl Nestle, MD  PROCEDURE IN DETAIL:  Under adequate general anesthesia, the patient was placed in a dorsal lithotomy position.  She was sterilely prepped and draped in usual fashion.  An indwelling Foley catheter was sterilely placed.  Sims retractor was used in the vagina.  The anterior portion of the cervix had a figure-of-eight sutures placed of 0 Vicryl.  The cervix was just then dilated, uterus after despite multiple attempts only sounded to 5 cm at which point #6 Rumi cannula was introduced into the uterine cavity.  On inflation of the balloon however it ruptured the balloon.  The Rumi was removed and on a second attempt the sound of uterus remained unchanged.  It was unclear whether or not it was due to intracavitary fibroids or other means of blocking the cavity.  At that point the decision was then transferred to the abdomen and small infraumbilical incision was made.  Veress needle was inserted and tested, opening pressure of 5 was noted, 3L of CO2 was ultimately insufflated.  The Veress needle was removed.  A 12-mm trocar with sleeve was introduced into the abdomen without incident.  A robotic camera was introduced.  The patient was placed in deep Trendelenburg.  Upper liver edge  was noted.  Pelvis was inspected.  Fibroid uterus was noted at that point.  Attempt to go back down to the vaginal portion was then performed and same results with respect to sound of uterus was achieved. At that point the automatic Rumi with #6 was inserted and decision made not to inflate the balloon and to use the assistant in the vaginal area to place counter traction with both the instruments in order to facilitate surgery.  Attention was then turned back to the abdomen.  The other ports were placed, 2 on the left, 1 of the 8 mm on the right along with a 12 mm assistant port site.  The 8-mm robotic instruments were then placed and a 12-mm assistant port was placed all under direct visualization. The robot was then brought to the left side of the patient, docked to these instruments and a Prograsper, PK and the monopolar scissors were placed in the sites.  At that point I turned to the surgical console. At the surgical console pelvis was inspected.  It was confirmed that there was indeed an enlarged left ovarian cyst slightly attached to the left ovarian fossa.  The right ureter could be easily seen peristalsing. The right tube and ovary was otherwise normal,  uterus consistent with fibroids.  Posterior cul-de-sac was without any lesions.  Starting on the left side, the retroperitoneal space was opened.  The ureter was identified deep in the pelvis.  The left uteroovarian vessels were isolated.  Clamps were then grasped with the PK, cauterized at several location and cut.  The left broad ligament was then clamped, cauterized, and then cut.  The uterine vessels were then skeletonized.  The bladder peritoneum was opened anteriorly where the peritoneum posteriorly was then opened.  On the contralateral side because the ovary was staying, the right uteroovarian ligament was clamped along  With the round ligament was then  cauterized and cut.  This was continued until the uterine vessels  were noted.  The remaining anterior leaf of the broad ligament was opened and the bladder was then sharply dissected off the lower uterine segment overlying the Koh ring.  The uterine vessels on the right were cauterized inferiorly and then ultimately cut going back on the opposite side.  Same procedure was performed with respect to uterine vessels with findings that both uterine vessels were have been cauterized and then cut.  The cervix was then detached from its vaginal attachment posteriorly first and carried around circumferentially anteriorly finally  completing the detachment. The specimen was the uterus and cervix which had been detached from its vaginal attachments, along with the left tube and ovary.  Because of the narrowness of the vagina, decision was made not to try to pull the uterus out to the vagina.  The uterine manipulator was removed.  The insufflator was placed and the vaginal cuff was then closed with 0 Vicryl figure-of-eight sutures.  The pelvis was inspected.  The left ovary had incidentally ruptured chocolate fluid filled cyst and the portion of the cyst that was attached to the left ovarian fossa was severed in the process of performing the surgery.  The right ovary and tube was otherwise unremarkable.  Decision made to keep the right tube and ovary. Abdomen was then irrigated and suctioned.  Good hemostasis noted.  I moved away from the console.  The robot was undocked and the Storz morcellator was inserted in the right lower quadrant assistant port site.  The specimen was morcellated and then removed in its entirety. The abdomen was irrigated.  The specimen weighed 224 g.  Good hemostasis noted in the pelvis after irrigation and suction had been done.  Surgicel was placed in the left lower quadrant overlying the peritoneum of the ovarian fossa from the removal of the left tube and ovary.  The instruments were then removed under direct visualization and the  incisions were closed with fascial stitch with 0 Vicryl on the supraumbilical and the right lower quadrant assistant port and then all remaining sites were then closed with 4-0 Vicryl subcuticular stitch.  Instruments from the vagina had been removed and a bimanual examination revealed the vaginal cuff was well approximated.  Procedure was felt to be complete.  SPECIMEN:  Uterus with cervix, left tube and ovary.  All sent to Pathology.  ESTIMATED BLOOD LOSS:  50 mL.  INTRAOPERATIVE FLUID:  2 L.  URINE OUTPUT:  400 mL clear yellow urine.  Sponge and instrument counts x2 was correct.  COMPLICATION:  None.  The patient tolerated the procedure well, was transferred to the recovery room in stable condition.     Maxie Better, M.D.     Gross/MEDQ  D:  03/17/2011  T:  03/18/2011  Job:  [id number]

## 2011-03-21 ENCOUNTER — Encounter (HOSPITAL_COMMUNITY): Payer: Self-pay | Admitting: Obstetrics and Gynecology

## 2011-08-20 ENCOUNTER — Emergency Department (INDEPENDENT_AMBULATORY_CARE_PROVIDER_SITE_OTHER)
Admission: EM | Admit: 2011-08-20 | Discharge: 2011-08-20 | Disposition: A | Discharge status: Home / Self Care | Payer: BC Managed Care – PPO | Source: Home / Self Care | Attending: Family Medicine | Admitting: Family Medicine

## 2011-08-20 DIAGNOSIS — L03039 Cellulitis of unspecified toe: Secondary | ICD-10-CM

## 2011-08-20 DIAGNOSIS — L02619 Cutaneous abscess of unspecified foot: Secondary | ICD-10-CM

## 2011-08-20 MED ORDER — CEPHALEXIN 500 MG PO CAPS: 500.0000 mg | ORAL_CAPSULE | Freq: Four times a day (QID) | ORAL | Status: AC

## 2011-08-20 MED ORDER — SULFAMETHOXAZOLE-TRIMETHOPRIM 800-160 MG PO TABS: 1.0000 | ORAL_TABLET | Freq: Two times a day (BID) | ORAL | Status: DC

## 2011-08-20 NOTE — Discharge Instructions (Signed)
Take antibiotics as directed. Return in 48 hours for re-evaluation by Dr. Juanetta Gosling (Monday 8 am - 5 pm, or Tuesday 10 am - 8 pm).

## 2011-08-20 NOTE — ED Notes (Signed)
Pt has blister to rt fifth toe since Wednesday and yesterday noticed redness and pain

## 2011-08-20 NOTE — ED Provider Notes (Signed)
History     CSN: [id number]  Arrival date & time 08/20/11  1827   First MD Initiated Contact with Patient 08/20/11 1834      Chief Complaint  Patient presents with  . Toe Pain    (Consider location/radiation/quality/duration/timing/severity/associated sxs/prior treatment) HPI Comments: Patricia Shields presents for evaluation of pain, swelling, and redness in her RIGHT 5th toe. She denies any injury, no break in the skin. She does notice a white area in between the 4th and 5th toes. Yesterday, she noticed the redness extending up her foot.  Patient is a 46 y.o. female presenting with lower extremity pain. The history is provided by the patient.  Foot Pain This is a new problem. The current episode started more than 2 days ago. The problem occurs constantly. The problem has been gradually worsening. The symptoms are aggravated by nothing. The symptoms are relieved by nothing. She has tried nothing for the symptoms.    Past Medical History  Diagnosis Date  . Anxiety   . Ovarian cyst, left   . Fibroids   . Hypercholesteremia   . Asthma     advair inhaler as needed  . GERD (gastroesophageal reflux disease)     occasional-no meds  . Carpal tunnel syndrome     hand brace prn    Past Surgical History  Procedure Date  . Tonsillectomy   . Cystectomy   . Ankle fracture surgery   . Appendectomy   . Salpingoophorectomy 03/17/2011    Procedure: SALPINGO OOPHERECTOMY;  Surgeon: Serita Kyle, MD;  Location: [facility];  Service: Gynecology;  Laterality: Left;    No family history on file.  History  Substance Use Topics  . Smoking status: Current Everyday Smoker -- 0.2 packs/day for 20 years    Types: Cigarettes  . Smokeless tobacco: Never Used  . Alcohol Use: Yes    OB History    Grav Para Term Preterm Abortions TAB SAB Ect Mult Living   0               Review of Systems  Constitutional: Negative.   HENT: Negative.   Eyes: Negative.   Respiratory: Negative.     Cardiovascular: Negative.   Gastrointestinal: Negative.   Genitourinary: Negative.   Musculoskeletal: Negative.   Skin: Positive for color change.       Redness, pain, and swelling in RIGHT 5th toe  Neurological: Negative.     Allergies  Bactrim; Codeine; and Demerol  Home Medications   Current Outpatient Rx  Name Route Sig Dispense Refill  . CEPHALEXIN 500 MG PO CAPS Oral Take 1 capsule (500 mg total) by mouth 4 (four) times daily. 28 capsule 0  . EZETIMIBE-SIMVASTATIN 10-40 MG PO TABS Oral Take 1 tablet by mouth daily.      . SERTRALINE HCL 100 MG PO TABS Oral Take 100 mg by mouth daily.      Marland Kitchen ZOLPIDEM TARTRATE 10 MG PO TABS Oral Take 10 mg by mouth at bedtime.       BP 143/97  Pulse 90  Temp(Src) 98.6 F (37 C) (Oral)  Resp 18  SpO2 98%  LMP 02/21/2011  Physical Exam  Nursing note and vitals reviewed. Constitutional: She is oriented to person, place, and time. She appears well-developed and well-nourished.  HENT:  Head: Normocephalic and atraumatic.  Eyes: EOM are normal.  Neck: Normal range of motion.  Pulmonary/Chest: Effort normal.  Musculoskeletal: Normal range of motion.       Right foot: She exhibits  tenderness and swelling. She exhibits normal capillary refill, no deformity and no laceration.       Feet:  Neurological: She is alert and oriented to person, place, and time.  Skin: Skin is warm and dry. There is erythema.       Erythema and swelling over 5th digit of RIGHT foot, with erythema extending over dorsum of foot and along lateral border  Psychiatric: Her behavior is normal.    ED Course  Procedures (including critical care time)  Labs Reviewed - No data to display No results found.   1. Cellulitis and abscess of toe       MDM  rx given for cephalexin; re-evaluate in 48 hours        Renaee Munda, MD 08/20/11 2131

## 2011-08-22 ENCOUNTER — Emergency Department (INDEPENDENT_AMBULATORY_CARE_PROVIDER_SITE_OTHER)
Admission: EM | Admit: 2011-08-22 | Discharge: 2011-08-22 | Disposition: A | Discharge status: Home / Self Care | Payer: BC Managed Care – PPO | Source: Home / Self Care | Attending: Family Medicine | Admitting: Family Medicine

## 2011-08-22 ENCOUNTER — Encounter (HOSPITAL_COMMUNITY): Payer: Self-pay | Admitting: *Deleted

## 2011-08-22 DIAGNOSIS — L02619 Cutaneous abscess of unspecified foot: Secondary | ICD-10-CM

## 2011-08-22 DIAGNOSIS — L03039 Cellulitis of unspecified toe: Secondary | ICD-10-CM

## 2011-08-22 NOTE — ED Notes (Signed)
Pt here for recheck of abscess on pinky toe.  Dr. Juanetta Gosling in to see pt prior to RN

## 2011-08-22 NOTE — ED Provider Notes (Signed)
History     CSN: [id number]  Arrival date & time 08/22/11  7829   First MD Initiated Contact with Patient 08/22/11 0820      No chief complaint on file.   (Consider location/radiation/quality/duration/timing/severity/associated sxs/prior treatment) HPI Comments: Patricia Shields returns for evaluation of infection in the fifth digit of her right foot. She was seen here 48 hours ago by this provider. She started on antibiotics and the skin was marked with a skin marker. She called the urgent care Center yesterday complaining of itching in the toe and was advised to use an over-the-counter topical steroid. She reports that this relieved the itching. The redness has improved with antibiotic use.  Patient is a 46 y.o. female presenting with wound check.  Wound Check  She was treated in the ED 2 to 3 days ago. Treatments since wound repair include oral antibiotics. Her temperature was unmeasured prior to arrival. There has been no drainage from the wound. The redness has improved. The swelling has improved. The pain has improved. She has no difficulty moving the affected extremity or digit.    Past Medical History  Diagnosis Date  . Anxiety   . Ovarian cyst, left   . Fibroids   . Hypercholesteremia   . Asthma     advair inhaler as needed  . GERD (gastroesophageal reflux disease)     occasional-no meds  . Carpal tunnel syndrome     hand brace prn    Past Surgical History  Procedure Date  . Tonsillectomy   . Cystectomy   . Ankle fracture surgery   . Appendectomy   . Salpingoophorectomy 03/17/2011    Procedure: SALPINGO OOPHERECTOMY;  Surgeon: Serita Kyle, MD;  Location: [facility];  Service: Gynecology;  Laterality: Left;    No family history on file.  History  Substance Use Topics  . Smoking status: Current Everyday Smoker -- 0.2 packs/day for 20 years    Types: Cigarettes  . Smokeless tobacco: Never Used  . Alcohol Use: Yes    OB History    Grav Para Term Preterm  Abortions TAB SAB Ect Mult Living   0               Review of Systems  Constitutional: Negative.   HENT: Negative.   Eyes: Negative.   Respiratory: Negative.   Cardiovascular: Negative.   Gastrointestinal: Negative.   Genitourinary: Negative.   Musculoskeletal: Negative.   Skin: Positive for color change.       Erythema, swelling, itching in 5th digit of RIGHT foot  Neurological: Negative.     Allergies  Bactrim; Codeine; and Demerol  Home Medications   Current Outpatient Rx  Name Route Sig Dispense Refill  . CEPHALEXIN 500 MG PO CAPS Oral Take 1 capsule (500 mg total) by mouth 4 (four) times daily. 28 capsule 0  . EZETIMIBE-SIMVASTATIN 10-40 MG PO TABS Oral Take 1 tablet by mouth daily.      . SERTRALINE HCL 100 MG PO TABS Oral Take 100 mg by mouth daily.      Marland Kitchen ZOLPIDEM TARTRATE 10 MG PO TABS Oral Take 10 mg by mouth at bedtime.       BP 128/80  Pulse 94  Temp(Src) 97.9 F (36.6 C) (Oral)  Resp 14  SpO2 99%  LMP 02/21/2011  Physical Exam  Nursing note and vitals reviewed. Constitutional: She is oriented to person, place, and time. She appears well-developed and well-nourished.  HENT:  Head: Normocephalic and atraumatic.  Eyes: EOM are  normal.  Neck: Normal range of motion.  Pulmonary/Chest: Effort normal.  Musculoskeletal: Normal range of motion.  Neurological: She is alert and oriented to person, place, and time.  Skin: Skin is warm, dry and intact. There is erythema.       RIGHT foot: erythema improved, within the area marked 48 hours ago; swelling improved; blister apparent between 4th and 5th digit intact with no drainage  Psychiatric: Her behavior is normal.    ED Course  Procedures (including critical care time)  Labs Reviewed - No data to display No results found.   1. Cellulitis and abscess of toe       MDM  Infection appears to be improving; continue antibiotics as directed; keep area clean with soap and water        Renaee Munda, MD 08/22/11 [phone]

## 2011-08-22 NOTE — Discharge Instructions (Signed)
Continue antibiotics and topical steroid creams as directed and as needed. Return to care should symptoms worsen in any way.

## 2014-01-09 ENCOUNTER — Ambulatory Visit: Payer: Self-pay | Admitting: Internal Medicine

## 2014-02-20 ENCOUNTER — Ambulatory Visit: Payer: Self-pay | Admitting: Internal Medicine

## 2014-05-20 ENCOUNTER — Other Ambulatory Visit: Payer: Self-pay

## 2014-12-18 ENCOUNTER — Ambulatory Visit
Admission: RE | Admit: 2014-12-18 | Discharge: 2014-12-18 | Disposition: A | Discharge status: Home / Self Care | Payer: BLUE CROSS/BLUE SHIELD | Source: Ambulatory Visit | Attending: Internal Medicine | Admitting: Internal Medicine

## 2014-12-18 ENCOUNTER — Other Ambulatory Visit: Payer: Self-pay | Admitting: Internal Medicine

## 2014-12-18 DIAGNOSIS — M79602 Pain in left arm: Secondary | ICD-10-CM | POA: Diagnosis not present

## 2014-12-18 DIAGNOSIS — M7989 Other specified soft tissue disorders: Secondary | ICD-10-CM

## 2014-12-18 DIAGNOSIS — M79622 Pain in left upper arm: Secondary | ICD-10-CM

## 2016-03-03 ENCOUNTER — Encounter: Payer: Self-pay | Admitting: Gastroenterology

## 2016-03-03 ENCOUNTER — Other Ambulatory Visit: Payer: Self-pay | Admitting: Gastroenterology

## 2016-03-03 ENCOUNTER — Ambulatory Visit
Admission: RE | Admit: 2016-03-03 | Discharge: 2016-03-03 | Disposition: A | Discharge status: Home / Self Care | Payer: BLUE CROSS/BLUE SHIELD | Source: Ambulatory Visit | Attending: Gastroenterology | Admitting: Gastroenterology

## 2016-03-03 DIAGNOSIS — R509 Fever, unspecified: Secondary | ICD-10-CM

## 2016-03-03 DIAGNOSIS — R1013 Epigastric pain: Secondary | ICD-10-CM

## 2016-03-03 MED ORDER — IOPAMIDOL (ISOVUE-300) INJECTION 61%
100.0000 mL | Freq: Once | INTRAVENOUS | Status: AC | PRN
  Administered 2016-03-03: 100 mL via INTRAVENOUS

## 2016-03-07 ENCOUNTER — Other Ambulatory Visit: Payer: Self-pay | Admitting: Internal Medicine

## 2016-03-07 DIAGNOSIS — R42 Dizziness and giddiness: Secondary | ICD-10-CM

## 2016-03-14 ENCOUNTER — Ambulatory Visit
Admission: RE | Admit: 2016-03-14 | Discharge: 2016-03-14 | Disposition: A | Discharge status: Home / Self Care | Payer: BLUE CROSS/BLUE SHIELD | Source: Ambulatory Visit | Attending: Internal Medicine | Admitting: Internal Medicine

## 2016-03-14 DIAGNOSIS — R42 Dizziness and giddiness: Secondary | ICD-10-CM | POA: Diagnosis present

## 2016-03-17 ENCOUNTER — Other Ambulatory Visit: Payer: BLUE CROSS/BLUE SHIELD

## 2016-03-28 ENCOUNTER — Ambulatory Visit: Payer: Self-pay | Admitting: Gastroenterology

## 2017-06-23 DIAGNOSIS — E785 Hyperlipidemia, unspecified: Secondary | ICD-10-CM | POA: Insufficient documentation

## 2017-06-23 DIAGNOSIS — L4050 Arthropathic psoriasis, unspecified: Secondary | ICD-10-CM | POA: Insufficient documentation

## 2017-06-23 DIAGNOSIS — I10 Essential (primary) hypertension: Secondary | ICD-10-CM | POA: Insufficient documentation

## 2017-06-23 DIAGNOSIS — M797 Fibromyalgia: Secondary | ICD-10-CM | POA: Insufficient documentation

## 2018-09-05 ENCOUNTER — Encounter: Payer: Self-pay | Admitting: *Deleted

## 2018-10-28 ENCOUNTER — Other Ambulatory Visit: Payer: Self-pay

## 2018-10-28 ENCOUNTER — Encounter (HOSPITAL_COMMUNITY): Payer: Self-pay | Admitting: Medical

## 2018-10-28 ENCOUNTER — Encounter (HOSPITAL_COMMUNITY): Payer: Self-pay | Admitting: Emergency Medicine

## 2018-10-28 ENCOUNTER — Emergency Department (HOSPITAL_COMMUNITY)
Admission: EM | Admit: 2018-10-28 | Discharge: 2018-10-28 | Disposition: A | Discharge status: Home / Self Care | Payer: BC Managed Care – PPO | Attending: Emergency Medicine | Admitting: Emergency Medicine

## 2018-10-28 DIAGNOSIS — Z79899 Other long term (current) drug therapy: Secondary | ICD-10-CM | POA: Insufficient documentation

## 2018-10-28 DIAGNOSIS — Y999 Unspecified external cause status: Secondary | ICD-10-CM | POA: Diagnosis not present

## 2018-10-28 DIAGNOSIS — F17210 Nicotine dependence, cigarettes, uncomplicated: Secondary | ICD-10-CM | POA: Diagnosis not present

## 2018-10-28 DIAGNOSIS — T24292A Burn of second degree of multiple sites of left lower limb, except ankle and foot, initial encounter: Secondary | ICD-10-CM | POA: Diagnosis not present

## 2018-10-28 DIAGNOSIS — T24032A Burn of unspecified degree of left lower leg, initial encounter: Secondary | ICD-10-CM | POA: Diagnosis present

## 2018-10-28 DIAGNOSIS — Y92000 Kitchen of unspecified non-institutional (private) residence as  the place of occurrence of the external cause: Secondary | ICD-10-CM | POA: Diagnosis not present

## 2018-10-28 DIAGNOSIS — T24002A Burn of unspecified degree of unspecified site of left lower limb, except ankle and foot, initial encounter: Secondary | ICD-10-CM

## 2018-10-28 DIAGNOSIS — X102XXA Contact with fats and cooking oils, initial encounter: Secondary | ICD-10-CM | POA: Diagnosis not present

## 2018-10-28 DIAGNOSIS — Y93G3 Activity, cooking and baking: Secondary | ICD-10-CM | POA: Insufficient documentation

## 2018-10-28 DIAGNOSIS — T310 Burns involving less than 10% of body surface: Secondary | ICD-10-CM | POA: Diagnosis not present

## 2018-10-28 MED ORDER — HYDROCODONE-ACETAMINOPHEN 5-325 MG PO TABS
2.0000 | ORAL_TABLET | Freq: Once | ORAL | Status: AC
  Administered 2018-10-28: 13:00:00 2 via ORAL
  Filled 2018-10-28: qty 2

## 2018-10-28 MED ORDER — HYDROCODONE-ACETAMINOPHEN 5-325 MG PO TABS: 1.0000 | ORAL_TABLET | Freq: Four times a day (QID) | ORAL | 0 refills | Status: DC | PRN

## 2018-10-28 MED ORDER — BACITRACIN ZINC 500 UNIT/GM EX OINT: 1.0000 "application " | TOPICAL_OINTMENT | Freq: Two times a day (BID) | CUTANEOUS | 0 refills | Status: DC

## 2018-10-28 MED ORDER — BACITRACIN ZINC 500 UNIT/GM EX OINT: TOPICAL_OINTMENT | Freq: Two times a day (BID) | CUTANEOUS | Status: DC

## 2018-10-28 MED ORDER — BACITRACIN-NEOMYCIN-POLYMYXIN OINTMENT TUBE
TOPICAL_OINTMENT | CUTANEOUS | Status: DC | PRN
  Administered 2018-10-28: 1 via TOPICAL
  Filled 2018-10-28: qty 14

## 2018-10-28 NOTE — ED Notes (Signed)
Patient given discharge instructions and verbalized understanding.  Patient stable to discharge at this time.  Patient is alert and oriented to baseline.  No distressed noted at this time.  All belongings taken with the patient at discharge.   
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
## 2018-10-28 NOTE — ED Triage Notes (Signed)
PT reports she can only take Dilaudid for pain because all other pain meds make her heart race. PA  Trixie Deis. Informed

## 2018-10-29 DIAGNOSIS — T24202A Burn of second degree of unspecified site of left lower limb, except ankle and foot, initial encounter: Secondary | ICD-10-CM | POA: Insufficient documentation

## 2018-10-30 ENCOUNTER — Institutional Professional Consult (permissible substitution): Payer: BC Managed Care – PPO | Admitting: Plastic Surgery

## 2018-11-01 ENCOUNTER — Ambulatory Visit: Payer: BLUE CROSS/BLUE SHIELD | Admitting: Gastroenterology

## 2018-12-20 ENCOUNTER — Other Ambulatory Visit: Payer: Self-pay

## 2018-12-20 ENCOUNTER — Ambulatory Visit: Payer: BC Managed Care – PPO | Admitting: Gastroenterology

## 2018-12-20 ENCOUNTER — Encounter: Payer: Self-pay | Admitting: Gastroenterology

## 2018-12-20 VITALS — BP 135/79 | Wt 216.2 lb

## 2018-12-20 DIAGNOSIS — K625 Hemorrhage of anus and rectum: Secondary | ICD-10-CM | POA: Diagnosis not present

## 2018-12-20 DIAGNOSIS — K219 Gastro-esophageal reflux disease without esophagitis: Secondary | ICD-10-CM | POA: Diagnosis not present

## 2018-12-20 DIAGNOSIS — Z86010 Personal history of colonic polyps: Secondary | ICD-10-CM

## 2018-12-20 DIAGNOSIS — D649 Anemia, unspecified: Secondary | ICD-10-CM | POA: Diagnosis not present

## 2018-12-20 NOTE — Progress Notes (Signed)
Chair can you help me 24months chair  Cephas Darby, MD [address]  [place]  [place], [place] [address]  Main: [phone]  Fax: [phone]    Gastroenterology Consultation  Referring Provider:     Casilda Carls, MD Primary Care Physician:  Casilda Carls, MD Primary Gastroenterologist:  Dr. Cephas Darby Reason for Consultation:     Rectal bleeding        HPI:   Patricia Shields is a 53 y.o. female referred by Dr. Casilda Carls, MD  for consultation & management of rectal bleeding  Had anal fissure in the past Has been experiencing rectal bleeding, BRBPR with clots, rectal pain, pressure, swelling, burning, worse in last few months Irregular bowel habits which are chronic a/w straining, hard stools Takes stool softener daily with modest benefit Didn't try anusol suppository prescribed by Dr Rosario Jacks Also, has central chest pain, burning for which she takes Protonix  NSAIDs: ibuprofen 600mg  daily for arthritits On MTX and folate for psoriatic arthritis  Antiplts/Anticoagulants/Anti thrombotics: None  GI Procedures: Patient underwent colonoscopy as well as upper endoscopy at [facility] gastroenterology in 2016 and found to have several polyps that were removed.  She was recommended to have repeat colonoscopy in 3 years.  Upper endoscopy was unremarkable, gastric biopsies were obtained, path report not available with me today  Past Medical History:  Diagnosis Date  . Anxiety   . Asthma    advair inhaler as needed  . Carpal tunnel syndrome    hand brace prn  . Fibroids   . GERD (gastroesophageal reflux disease)    occasional-no meds  . Hypercholesteremia   . Ovarian cyst, left     Past Surgical History:  Procedure Laterality Date  . ANKLE FRACTURE SURGERY    . APPENDECTOMY    . CYSTECTOMY    . SALPINGOOPHORECTOMY  03/17/2011   Procedure: SALPINGO OOPHERECTOMY;  Surgeon: Marvene Staff, MD;  Location: [facility];  Service: Gynecology;  Laterality: Left;   . TONSILLECTOMY      Current Outpatient Medications:  .  amLODipine (NORVASC) 5 MG tablet, , Disp: , Rfl:  .  B-D TB SYRINGE 1CC/25GX5/8" 25G X 5/8" 1 ML MISC, USE TO INJECT MEDICATION ONCE A WEEK SUBCUTANEOUSLY, Disp: , Rfl:  .  bacitracin ointment, Apply 1 application topically 2 (two) times daily., Disp: 120 g, Rfl: 0 .  Cholecalciferol (VITAMIN D3) 1.25 MG (50000 UT) CAPS, TAKE 1 CAPSULE BY MOUTH ONE TIME PER WEEK, Disp: , Rfl:  .  cyclobenzaprine (FLEXERIL) 10 MG tablet, Take 10 mg by mouth every 8 (eight) hours., Disp: , Rfl:  .  folic acid (FOLVITE) 1 MG tablet, Take 1 mg by mouth daily., Disp: , Rfl:  .  hydrocortisone (ANUSOL-HC) 25 MG suppository, UNWRAP AND INSERT ONE SUPPOSITORY INTO THE RECTUM TWICE A DAY, Disp: , Rfl:  .  ibuprofen (ADVIL) 600 MG tablet, Take 600 mg by mouth 3 (three) times daily., Disp: , Rfl:  .  methotrexate 50 MG/2ML injection, INJECT 0.8 ML ONCE A WEEK, Disp: , Rfl:  .  pantoprazole (PROTONIX) 40 MG tablet, Take 40 mg by mouth daily., Disp: , Rfl:  .  sertraline (ZOLOFT) 100 MG tablet, Take 100 mg by mouth daily.  , Disp: , Rfl:  .  simvastatin (ZOCOR) 20 MG tablet, , Disp: , Rfl:  .  zolpidem (AMBIEN) 5 MG tablet, Take 5 mg by mouth at bedtime., Disp: , Rfl: ]  History reviewed. No pertinent family history.  Social History   Tobacco Use  . Smoking status: Current Every Day Smoker    Packs/day: 0.50    Years: 20.00    Pack years: 10.00    Types: Cigarettes  . Smokeless tobacco: Never Used  Substance Use Topics  . Alcohol use: Yes    Comment: occ  . Drug use: No    Allergies as of 12/20/2018 - Review Complete 12/20/2018  Allergen Reaction Noted  . Bactrim  08/20/2011  . Codeine Other (See Comments) 03/02/2011  . Demerol Other (See Comments) 03/02/2011  . Meperidine Palpitations 12/20/2018    Review of Systems:    All systems reviewed and negative except where noted in HPI.   Physical Exam:  BP 135/79 (BP Location: Right Arm)   Wt  216 lb 3.2 oz (98.1 kg)   LMP 02/21/2011   BMI 34.90 kg/m  Patient's last menstrual period was 02/21/2011.  General:   Alert,  Well-developed, well-nourished, pleasant and cooperative in NAD Head:  Normocephalic and atraumatic. Eyes:  Sclera clear, no icterus.   Conjunctiva pink. Ears:  Normal auditory acuity. Nose:  No deformity, discharge, or lesions. Mouth:  No deformity or lesions,oropharynx pink & moist. Neck:  Supple; no masses or thyromegaly. Lungs:  Respirations even and unlabored.  Clear throughout to auscultation.   No wheezes, crackles, or rhonchi. No acute distress. Heart:  Regular rate and rhythm; no murmurs, clicks, rubs, or gallops. Abdomen:  Normal bowel sounds. Soft, non-tender and non-distended without masses, hepatosplenomegaly or hernias noted.  No guarding or rebound tenderness.   Rectal: Not performed Msk:  Symmetrical without gross deformities. Good, equal movement & strength bilaterally. Pulses:  Normal pulses noted. Extremities:  No clubbing or edema.  No cyanosis. Neurologic:  Alert and oriented x3;  grossly normal neurologically. Skin:  Intact without significant lesions or rashes. No jaundice. Psych:  Alert and cooperative. Normal mood and affect.  Imaging Studies: Reviewed  Assessment and Plan:   Patricia Shields is a 53 y.o. female with obesity, psoriatic arthritis on methotrexate plus folic acid seen in consultation for chronic rectal bleeding, history of tubular adenomas, GERD  History of tubular adenomas of colon Recommend surveillance colonoscopy and patient is agreeable  Chronic intermittent rectal bleeding Her symptoms are highly consistent with grade 2 hemorrhoids Discussed with her about hemorrhoid ligation after colonoscopy Discussed with her about high-fiber diet, fiber supplements, stool softeners  GERD She underwent EGD in 2016 which was unremarkable Discussed with her about antireflux lifestyle Continue Protonix 40 mg twice daily    Follow up in 3 weeks   Cephas Darby, MD

## 2018-12-20 NOTE — Patient Instructions (Signed)

## 2018-12-21 LAB — HEMOGLOBIN A1C
Est. average glucose Bld gHb Est-mCnc: 117 mg/dL
Hgb A1c MFr Bld: 5.7 % — ABNORMAL HIGH (ref 4.8–5.6)

## 2018-12-21 LAB — IRON AND TIBC
Iron Saturation: 25 % (ref 15–55)
Iron: 59 ug/dL (ref 27–159)
Total Iron Binding Capacity: 238 ug/dL — ABNORMAL LOW (ref 250–450)
UIBC: 179 ug/dL (ref 131–425)

## 2018-12-21 LAB — CBC
Hematocrit: 39.4 % (ref 34.0–46.6)
Hemoglobin: 13.7 g/dL (ref 11.1–15.9)
MCH: 34.5 pg — ABNORMAL HIGH (ref 26.6–33.0)
MCHC: 34.8 g/dL (ref 31.5–35.7)
MCV: 99 fL — ABNORMAL HIGH (ref 79–97)
Platelets: 254 10*3/uL (ref 150–450)
RBC: 3.97 x10E6/uL (ref 3.77–5.28)
RDW: 11.9 % (ref 11.7–15.4)
WBC: 8.9 10*3/uL (ref 3.4–10.8)

## 2018-12-21 LAB — LIPID PANEL W/O CHOL/HDL RATIO
Cholesterol, Total: 230 mg/dL — ABNORMAL HIGH (ref 100–199)
HDL: 46 mg/dL (ref >39)
LDL Calculated: 131 mg/dL — ABNORMAL HIGH (ref 0–99)
Triglycerides: 265 mg/dL — ABNORMAL HIGH (ref 0–149)
VLDL Cholesterol Cal: 53 mg/dL — ABNORMAL HIGH (ref 5–40)

## 2018-12-21 LAB — B12 AND FOLATE PANEL
Folate: >20 ng/mL (ref >3.0)
Vitamin B-12: 432 pg/mL (ref 232–1245)

## 2018-12-21 LAB — FERRITIN: Ferritin: 89 ng/mL (ref 15–150)

## 2018-12-24 ENCOUNTER — Encounter: Payer: Self-pay | Admitting: Gastroenterology

## 2019-01-01 ENCOUNTER — Other Ambulatory Visit
Admission: RE | Admit: 2019-01-01 | Discharge: 2019-01-01 | Disposition: A | Discharge status: Home / Self Care | Payer: BC Managed Care – PPO | Source: Ambulatory Visit | Attending: Gastroenterology | Admitting: Gastroenterology

## 2019-01-01 ENCOUNTER — Other Ambulatory Visit: Payer: Self-pay

## 2019-01-01 DIAGNOSIS — Z01812 Encounter for preprocedural laboratory examination: Secondary | ICD-10-CM | POA: Diagnosis present

## 2019-01-01 DIAGNOSIS — Z20828 Contact with and (suspected) exposure to other viral communicable diseases: Secondary | ICD-10-CM | POA: Insufficient documentation

## 2019-01-01 LAB — SARS CORONAVIRUS 2 (TAT 6-24 HRS): SARS Coronavirus 2: NEGATIVE

## 2019-01-04 ENCOUNTER — Ambulatory Visit: Payer: BC Managed Care – PPO | Admitting: Anesthesiology

## 2019-01-04 ENCOUNTER — Encounter
Admission: RE | Disposition: A | Discharge status: Home / Self Care | Payer: Self-pay | Source: Home / Self Care | Attending: Gastroenterology

## 2019-01-04 ENCOUNTER — Ambulatory Visit
Admission: RE | Admit: 2019-01-04 | Discharge: 2019-01-04 | Disposition: A | Discharge status: Home / Self Care | Payer: BC Managed Care – PPO | Attending: Gastroenterology | Admitting: Gastroenterology

## 2019-01-04 ENCOUNTER — Encounter: Payer: Self-pay | Admitting: *Deleted

## 2019-01-04 DIAGNOSIS — Z860100 Personal history of colon polyps, unspecified: Secondary | ICD-10-CM

## 2019-01-04 DIAGNOSIS — I10 Essential (primary) hypertension: Secondary | ICD-10-CM | POA: Insufficient documentation

## 2019-01-04 DIAGNOSIS — K635 Polyp of colon: Secondary | ICD-10-CM | POA: Diagnosis not present

## 2019-01-04 DIAGNOSIS — K625 Hemorrhage of anus and rectum: Secondary | ICD-10-CM | POA: Insufficient documentation

## 2019-01-04 DIAGNOSIS — D120 Benign neoplasm of cecum: Secondary | ICD-10-CM | POA: Insufficient documentation

## 2019-01-04 DIAGNOSIS — E7800 Pure hypercholesterolemia, unspecified: Secondary | ICD-10-CM | POA: Insufficient documentation

## 2019-01-04 DIAGNOSIS — K219 Gastro-esophageal reflux disease without esophagitis: Secondary | ICD-10-CM | POA: Diagnosis not present

## 2019-01-04 DIAGNOSIS — Z79899 Other long term (current) drug therapy: Secondary | ICD-10-CM | POA: Insufficient documentation

## 2019-01-04 DIAGNOSIS — F419 Anxiety disorder, unspecified: Secondary | ICD-10-CM | POA: Diagnosis not present

## 2019-01-04 DIAGNOSIS — Z8719 Personal history of other diseases of the digestive system: Secondary | ICD-10-CM | POA: Diagnosis not present

## 2019-01-04 DIAGNOSIS — F17210 Nicotine dependence, cigarettes, uncomplicated: Secondary | ICD-10-CM | POA: Diagnosis not present

## 2019-01-04 DIAGNOSIS — K644 Residual hemorrhoidal skin tags: Secondary | ICD-10-CM | POA: Diagnosis not present

## 2019-01-04 HISTORY — PX: COLONOSCOPY WITH PROPOFOL: SHX5780

## 2019-01-04 SURGERY — COLONOSCOPY WITH PROPOFOL
Anesthesia: General

## 2019-01-04 MED ORDER — SODIUM CHLORIDE 0.9 % IV SOLN
INTRAVENOUS | Status: DC
  Administered 2019-01-04: 09:00:00 via INTRAVENOUS

## 2019-01-04 MED ORDER — PROPOFOL 10 MG/ML IV BOLUS
INTRAVENOUS | Status: DC | PRN
  Administered 2019-01-04 (×10): 50 mg via INTRAVENOUS

## 2019-01-04 MED ORDER — LIDOCAINE HCL (PF) 1 % IJ SOLN
INTRAMUSCULAR | Status: AC
  Filled 2019-01-04: qty 2

## 2019-01-04 NOTE — Op Note (Addendum)
[facility] Gastroenterology Patient Name: Patricia Shields Procedure Date: 01/04/2019 9:13 AM MRN: [id number] Account #: [id number] Date of Birth: 1966/02/01 Admit Type: Outpatient Age: 53 Room: [facility] ENDO ROOM 3 Gender: Female Note Status: Finalized Procedure:            Colonoscopy Indications:          High risk colon cancer surveillance: Personal history                        of colonic polyps Providers:            Lin Landsman MD, MD Referring MD:         Casilda Carls, MD (Referring MD) Medicines:            Monitored Anesthesia Care Complications:        No immediate complications. Estimated blood loss: None. Procedure:            Pre-Anesthesia Assessment:                       - Prior to the procedure, a History and Physical was                        performed, and patient medications and allergies were                        reviewed. The patient is competent. The risks and                        benefits of the procedure and the sedation options and                        risks were discussed with the patient. All questions                        were answered and informed consent was obtained.                        Patient identification and proposed procedure were                        verified by the physician, the nurse, the                        anesthesiologist, the anesthetist and the technician in                        the pre-procedure area in the procedure room in the                        endoscopy suite. Mental Status Examination: alert and                        oriented. Airway Examination: normal oropharyngeal                        airway and neck mobility. Respiratory Examination:                        clear to auscultation. CV Examination: normal.  Prophylactic Antibiotics: The patient does not require                        prophylactic antibiotics. Prior Anticoagulants: The                         patient has taken no previous anticoagulant or                        antiplatelet agents. ASA Grade Assessment: II - A                        patient with mild systemic disease. After reviewing the                        risks and benefits, the patient was deemed in                        satisfactory condition to undergo the procedure. The                        anesthesia plan was to use monitored anesthesia care                        (MAC). Immediately prior to administration of                        medications, the patient was re-assessed for adequacy                        to receive sedatives. The heart rate, respiratory rate,                        oxygen saturations, blood pressure, adequacy of                        pulmonary ventilation, and response to care were                        monitored throughout the procedure. The physical status                        of the patient was re-assessed after the procedure.                       After obtaining informed consent, the colonoscope was                        passed under direct vision. Throughout the procedure,                        the patient's blood pressure, pulse, and oxygen                        saturations were monitored continuously. The                        Colonoscope was introduced through the anus and  advanced to the the cecum, identified by appendiceal                        orifice and ileocecal valve. The colonoscopy was                        performed with moderate difficulty due to significant                        looping and the patient's body habitus. Successful                        completion of the procedure was aided by changing the                        patient to a prone position and applying abdominal                        pressure. The patient tolerated the procedure well. The                        quality of the bowel preparation was adequate to                         identify polyps 6 mm and larger in size. Findings:      The perianal and digital rectal examinations were normal. Pertinent       negatives include normal sphincter tone and no palpable rectal lesions.      A 5 mm polyp was found in the cecum. The polyp was sessile. The polyp       was removed with a cold biopsy forceps. Resection and retrieval were       complete.      Non-bleeding external hemorrhoids were found during retroflexion. The       hemorrhoids were large. Impression:           - One 5 mm polyp in the cecum, removed with a cold                        biopsy forceps. Resected and retrieved.                       - Non-bleeding external hemorrhoids. Recommendation:       - Discharge patient to home (with escort).                       - Resume previous diet today.                       - Continue present medications.                       - Await pathology results.                       - Repeat colonoscopy in 5 years for surveillance.                       - Return to my office as previously scheduled for  hemorrhoid ligation Procedure Code(s):    --- Professional ---                       [id number], Colonoscopy, flexible; with biopsy, single or                        multiple Diagnosis Code(s):    --- Professional ---                       K63.5, Polyp of colon                       K64.4, Residual hemorrhoidal skin tags                       K62.5, Hemorrhage of anus and rectum CPT copyright 2019 American Medical Association. All rights reserved. The codes documented in this report are preliminary and upon coder review may  be revised to meet current compliance requirements. Dr. Ulyess Mort Lin Landsman MD, MD 01/04/2019 9:45:40 AM This report has been signed electronically. Number of Addenda: 0 Note Initiated On: 01/04/2019 9:13 AM Scope Withdrawal Time: 0 hours 10 minutes 6 seconds  Total Procedure Duration: 0 hours 19 minutes 54 seconds   Estimated Blood Loss: Estimated blood loss: none.      [facility]

## 2019-01-04 NOTE — Transfer of Care (Signed)
Immediate Anesthesia Transfer of Care Note  Patient: Patricia Shields  Procedure(s) Performed: COLONOSCOPY WITH PROPOFOL (N/A )  Patient Location: Endoscopy Unit  Anesthesia Type:General  Level of Consciousness: awake, alert , oriented and patient cooperative  Airway & Oxygen Therapy: Patient Spontanous Breathing  Post-op Assessment: Report given to RN and Post -op Vital signs reviewed and stable  Post vital signs: Reviewed and stable  Last Vitals:  Vitals Value Taken Time  BP    Temp    Pulse    Resp    SpO2      Last Pain:  Vitals:   01/04/19 0820  TempSrc: Tympanic  PainSc: 0-No pain         Complications: No apparent anesthesia complications

## 2019-01-04 NOTE — Anesthesia Post-op Follow-up Note (Signed)
Anesthesia QCDR form completed.        

## 2019-01-04 NOTE — Anesthesia Preprocedure Evaluation (Signed)
Anesthesia Evaluation  Patient identified by MRN, date of birth, ID band Patient awake    Reviewed: Allergy & Precautions, H&P , NPO status , Patient's Chart, lab work & pertinent test results, reviewed documented beta blocker date and time   Airway Mallampati: II   Neck ROM: full    Dental  (+) Poor Dentition, Teeth Intact   Pulmonary asthma , Current Smoker and Patient abstained from smoking.,    Pulmonary exam normal        Cardiovascular Exercise Tolerance: Good hypertension, On Medications Normal cardiovascular exam Rhythm:regular Rate:Normal     Neuro/Psych Anxiety  Neuromuscular disease negative psych ROS   GI/Hepatic Neg liver ROS, GERD  ,  Endo/Other  negative endocrine ROS  Renal/GU negative Renal ROS  negative genitourinary   Musculoskeletal   Abdominal   Peds  Hematology negative hematology ROS (+)   Anesthesia Other Findings Past Medical History: No date: Anxiety No date: Asthma     Comment:  advair inhaler as needed No date: Carpal tunnel syndrome     Comment:  hand brace prn No date: Fibroids No date: GERD (gastroesophageal reflux disease)     Comment:  occasional-no meds No date: Hypercholesteremia No date: Ovarian cyst, left Past Surgical History: No date: ANKLE FRACTURE SURGERY No date: APPENDECTOMY No date: CYSTECTOMY 03/17/2011: SALPINGOOPHORECTOMY     Comment:  Procedure: SALPINGO OOPHERECTOMY;  Surgeon: Marvene Staff, MD;  Location: [facility];  Service: Gynecology;                Laterality: Left; No date: TONSILLECTOMY BMI    Body Mass Index: 33.89 kg/m     Reproductive/Obstetrics negative OB ROS                             Anesthesia Physical Anesthesia Plan  ASA: II  Anesthesia Plan: General   Post-op Pain Management:    Induction:   PONV Risk Score and Plan:   Airway Management Planned:   Additional Equipment:    Intra-op Plan:   Post-operative Plan:   Informed Consent: I have reviewed the patients History and Physical, chart, labs and discussed the procedure including the risks, benefits and alternatives for the proposed anesthesia with the patient or authorized representative who has indicated his/her understanding and acceptance.     Dental Advisory Given  Plan Discussed with: CRNA  Anesthesia Plan Comments:         Anesthesia Quick Evaluation

## 2019-01-04 NOTE — H&P (Signed)
Cephas Darby, MD [address]  [place]  [place], [place] [address]  Main: [phone]  Fax: [phone] Pager: [phone]  Primary Care Physician:  Casilda Carls, MD Primary Gastroenterologist:  Dr. Cephas Darby  Pre-Procedure History & Physical: HPI:  ADEAN MILOSEVIC is a 53 y.o. female is here for an colonoscopy.   Past Medical History:  Diagnosis Date  . Anxiety   . Asthma    advair inhaler as needed  . Carpal tunnel syndrome    hand brace prn  . Fibroids   . GERD (gastroesophageal reflux disease)    occasional-no meds  . Hypercholesteremia   . Ovarian cyst, left     Past Surgical History:  Procedure Laterality Date  . ANKLE FRACTURE SURGERY    . APPENDECTOMY    . CYSTECTOMY    . SALPINGOOPHORECTOMY  03/17/2011   Procedure: SALPINGO OOPHERECTOMY;  Surgeon: Marvene Staff, MD;  Location: [facility];  Service: Gynecology;  Laterality: Left;  . TONSILLECTOMY      Prior to Admission medications   Medication Sig Start Date End Date Taking? Authorizing Provider  ibuprofen (ADVIL) 600 MG tablet Take 600 mg by mouth 3 (three) times daily. 10/03/18  Yes [provider]  methotrexate 50 MG/2ML injection INJECT 0.8 ML ONCE A WEEK 12/04/18  Yes [provider]  zolpidem (AMBIEN) 5 MG tablet Take 5 mg by mouth at bedtime. 09/23/18  Yes [provider]  amLODipine (NORVASC) 5 MG tablet  12/18/18   [provider]  B-D TB SYRINGE 1CC/25GX5/8" 25G X 5/8" 1 ML MISC USE TO INJECT MEDICATION ONCE A WEEK SUBCUTANEOUSLY 10/02/18   [provider]  bacitracin ointment Apply 1 application topically 2 (two) times daily. Patient not taking: Reported on 01/04/2019 10/28/18   Deliah Boston, PA-C  Cholecalciferol (VITAMIN D3) 1.25 MG (50000 UT) CAPS TAKE 1 CAPSULE BY MOUTH ONE TIME PER WEEK 11/02/18   [provider]  cyclobenzaprine (FLEXERIL) 10 MG tablet Take 10 mg by mouth every 8 (eight) hours. 11/20/18   [provider]  folic acid (FOLVITE) 1 MG tablet Take 1 mg by mouth daily. 09/12/18   [provider]  hydrocortisone (ANUSOL-HC) 25 MG suppository UNWRAP AND INSERT ONE SUPPOSITORY INTO THE RECTUM TWICE A DAY 12/04/18   [provider]  pantoprazole (PROTONIX) 40 MG tablet Take 40 mg by mouth daily. 11/11/18   [provider]  sertraline (ZOLOFT) 100 MG tablet Take 100 mg by mouth daily.      [provider]  simvastatin (ZOCOR) 20 MG tablet  12/18/18   [provider]    Allergies as of 12/20/2018 - Review Complete 12/20/2018  Allergen Reaction Noted  . Bactrim  08/20/2011  . Codeine Other (See Comments) 03/02/2011  . Demerol Other (See Comments) 03/02/2011  . Meperidine Palpitations 12/20/2018    History reviewed. No pertinent family history.  Social History   Socioeconomic History  . Marital status: Married    Spouse name: Not on file  . Number of children: Not on file  . Years of education: Not on file  . Highest education level: Not on file  Occupational History  . Not on file  Social Needs  . Financial resource strain: Not on file  . Food insecurity    Worry: Not on file    Inability: Not on file  . Transportation needs    Medical: Not on file    Non-medical: Not on file  Tobacco Use  .  Smoking status: Current Every Day Smoker    Packs/day: 0.50    Years: 20.00    Pack years: 10.00    Types: Cigarettes  . Smokeless tobacco: Never Used  Substance and Sexual Activity  . Alcohol use: Yes    Comment: occ  . Drug use: No  . Sexual activity: Yes  Lifestyle  . Physical activity    Days per week: Not on file    Minutes per session: Not on file  . Stress: Not on file  Relationships  . Social Herbalist on phone: Not on file    Gets together: Not on file    Attends religious service: Not on file    Active member of club or organization: Not on file    Attends meetings of clubs or organizations: Not on file     Relationship status: Not on file  . Intimate partner violence    Fear of current or ex partner: Not on file    Emotionally abused: Not on file    Physically abused: Not on file    Forced sexual activity: Not on file  Other Topics Concern  . Not on file  Social History Narrative  . Not on file    Review of Systems: See HPI, otherwise negative ROS  Physical Exam: BP (!) 143/87   Pulse 83   Temp (!) 96.3 F (35.7 C) (Tympanic)   Resp 16   Ht 5\' 6"  (1.676 m)   Wt 95.3 kg   LMP 02/21/2011   SpO2 100%   BMI 33.89 kg/m  General:   Alert,  pleasant and cooperative in NAD Head:  Normocephalic and atraumatic. Neck:  Supple; no masses or thyromegaly. Lungs:  Clear throughout to auscultation.    Heart:  Regular rate and rhythm. Abdomen:  Soft, nontender and nondistended. Normal bowel sounds, without guarding, and without rebound.   Neurologic:  Alert and  oriented x4;  grossly normal neurologically.  Impression/Plan: Elisabeth Cara is here for an colonoscopy to be performed for rectal bleeding  Risks, benefits, limitations, and alternatives regarding  colonoscopy have been reviewed with the patient.  Questions have been answered.  All parties agreeable.   Sherri Sear, MD  01/04/2019, 8:41 AM

## 2019-01-07 ENCOUNTER — Encounter: Payer: Self-pay | Admitting: Gastroenterology

## 2019-01-07 LAB — SURGICAL PATHOLOGY

## 2019-01-09 NOTE — Anesthesia Postprocedure Evaluation (Signed)
Anesthesia Post Note  Patient: Patricia Shields  Procedure(s) Performed: COLONOSCOPY WITH PROPOFOL (N/A )  Patient location during evaluation: PACU Anesthesia Type: General Level of consciousness: awake and alert Pain management: pain level controlled Vital Signs Assessment: post-procedure vital signs reviewed and stable Respiratory status: spontaneous breathing, nonlabored ventilation, respiratory function stable and patient connected to nasal cannula oxygen Cardiovascular status: blood pressure returned to baseline and stable Postop Assessment: no apparent nausea or vomiting Anesthetic complications: no     Last Vitals:  Vitals:   01/04/19 1007 01/04/19 1017  BP: 111/73 116/72  Pulse: 78 79  Resp: 18 17  Temp:    SpO2: 97% 100%    Last Pain:  Vitals:   01/04/19 1017  TempSrc:   PainSc: 0-No pain                 Molli Barrows

## 2019-01-15 ENCOUNTER — Ambulatory Visit (INDEPENDENT_AMBULATORY_CARE_PROVIDER_SITE_OTHER): Payer: BC Managed Care – PPO | Admitting: Gastroenterology

## 2019-01-15 ENCOUNTER — Other Ambulatory Visit: Payer: Self-pay

## 2019-01-15 ENCOUNTER — Encounter: Payer: Self-pay | Admitting: Gastroenterology

## 2019-01-15 VITALS — BP 143/80 | HR 85 | Temp 98.2°F

## 2019-01-15 DIAGNOSIS — K602 Anal fissure, unspecified: Secondary | ICD-10-CM

## 2019-01-15 DIAGNOSIS — K5909 Other constipation: Secondary | ICD-10-CM

## 2019-01-15 NOTE — Progress Notes (Signed)
Cephas Darby, MD [address]  [place]  [place], [place] [address]  Main: [phone]  Fax: [phone]    Gastroenterology Consultation  Referring Provider:     Casilda Carls, MD Primary Care Physician:  Casilda Carls, MD Primary Gastroenterologist:  Dr. Cephas Darby Reason for Consultation:     Rectal bleeding        HPI:   Patricia Shields is a 53 y.o. female referred by Dr. Casilda Carls, MD  for consultation & management of rectal bleeding  Had anal fissure in the past Has been experiencing rectal bleeding, BRBPR with clots, rectal pain, pressure, swelling, burning, worse in last few months Irregular bowel habits which are chronic a/w straining, hard stools Takes stool softener daily with modest benefit Didn't try anusol suppository prescribed by Dr Rosario Jacks Also, has central chest pain, burning for which she takes Protonix  Follow up visit 01/15/19 Patient underwent colonoscopy which revealed 5 mm tubular adenoma in the cecum as well as external hemorrhoids.  She reports having severe sharp, knifelike pain in her rectum during and after bowel movement.  This was worse last weekend.  She has hard time regulating her bowel movements despite taking stool softener and fiber.  She has not tried Linzess samples that I provided her  NSAIDs: ibuprofen 600mg  daily for arthritits On MTX and folate for psoriatic arthritis  Antiplts/Anticoagulants/Anti thrombotics: None  GI Procedures: Patient underwent colonoscopy as well as upper endoscopy at [facility] gastroenterology in 2016 and found to have several polyps that were removed.  She was recommended to have repeat colonoscopy in 3 years.  Upper endoscopy was unremarkable, gastric biopsies were obtained, path report not available with me today  Colonoscopy 01/04/2019 - One 5 mm polyp in the cecum, removed with a cold biopsy forceps. Resected and retrieved. - Non-bleeding external hemorrhoids.  DIAGNOSIS:  A. COLON  POLYP, CECUM; COLD BIOPSY:  - TUBULAR ADENOMA.  - NEGATIVE FOR HIGH-GRADE DYSPLASIA AND MALIGNANCY.   Past Medical History:  Diagnosis Date  . Anxiety   . Asthma    advair inhaler as needed  . Carpal tunnel syndrome    hand brace prn  . Fibroids   . GERD (gastroesophageal reflux disease)    occasional-no meds  . Hypercholesteremia   . Ovarian cyst, left     Past Surgical History:  Procedure Laterality Date  . ANKLE FRACTURE SURGERY    . APPENDECTOMY    . COLONOSCOPY WITH PROPOFOL N/A 01/04/2019   Procedure: COLONOSCOPY WITH PROPOFOL;  Surgeon: Lin Landsman, MD;  Location: [facility];  Service: Gastroenterology;  Laterality: N/A;  . CYSTECTOMY    . SALPINGOOPHORECTOMY  03/17/2011   Procedure: SALPINGO OOPHERECTOMY;  Surgeon: Marvene Staff, MD;  Location: [facility];  Service: Gynecology;  Laterality: Left;  . TONSILLECTOMY      Current Outpatient Medications:  .  amLODipine (NORVASC) 5 MG tablet, , Disp: , Rfl:  .  B-D INSULIN SYRINGE 1CC/25G 25G X 5/8" 1 ML MISC, USE TO INJECT MEDICATION ONCE PER WEEK AS DIRECTED, Disp: , Rfl:  .  B-D TB SYRINGE 1CC/25GX5/8" 25G X 5/8" 1 ML MISC, USE TO INJECT MEDICATION ONCE A WEEK SUBCUTANEOUSLY, Disp: , Rfl:  .  Cholecalciferol (VITAMIN D3) 1.25 MG (50000 UT) CAPS, TAKE 1 CAPSULE BY MOUTH ONE TIME PER WEEK, Disp: , Rfl:  .  cyclobenzaprine (FLEXERIL) 10 MG tablet, Take 10 mg by mouth every 8 (eight) hours., Disp: , Rfl:  .  folic acid (FOLVITE)  1 MG tablet, Take 1 mg by mouth daily., Disp: , Rfl:  .  hydrocortisone (ANUSOL-HC) 25 MG suppository, UNWRAP AND INSERT ONE SUPPOSITORY INTO THE RECTUM TWICE A DAY, Disp: , Rfl:  .  ibuprofen (ADVIL) 600 MG tablet, Take 600 mg by mouth 3 (three) times daily., Disp: , Rfl:  .  methotrexate 50 MG/2ML injection, INJECT 0.8 ML ONCE A WEEK, Disp: , Rfl:  .  pantoprazole (PROTONIX) 40 MG tablet, Take 40 mg by mouth daily., Disp: , Rfl:  .  sertraline (ZOLOFT) 100 MG tablet, Take 100 mg by  mouth daily.  , Disp: , Rfl:  .  simvastatin (ZOCOR) 20 MG tablet, , Disp: , Rfl:  .  zolpidem (AMBIEN) 5 MG tablet, Take 5 mg by mouth at bedtime., Disp: , Rfl: ]  No family history on file.   Social History   Tobacco Use  . Smoking status: Current Every Day Smoker    Packs/day: 0.50    Years: 20.00    Pack years: 10.00    Types: Cigarettes  . Smokeless tobacco: Never Used  Substance Use Topics  . Alcohol use: Yes    Comment: occ  . Drug use: No    Allergies as of 01/15/2019 - Review Complete 01/15/2019  Allergen Reaction Noted  . Bactrim  08/20/2011  . Codeine Other (See Comments) 03/02/2011  . Demerol Other (See Comments) 03/02/2011  . Meperidine Palpitations 12/20/2018    Review of Systems:    All systems reviewed and negative except where noted in HPI.   Physical Exam:  BP (!) 143/80   Pulse 85   Temp 98.2 F (36.8 C) (Oral)   LMP 02/21/2011  Patient's last menstrual period was 02/21/2011.  General:   Alert,  Well-developed, well-nourished, pleasant and cooperative in NAD Head:  Normocephalic and atraumatic. Eyes:  Sclera clear, no icterus.   Conjunctiva pink. Ears:  Normal auditory acuity. Nose:  No deformity, discharge, or lesions. Mouth:  No deformity or lesions,oropharynx pink & moist. Neck:  Supple; no masses or thyromegaly. Lungs:  Respirations even and unlabored.  Clear throughout to auscultation.   No wheezes, crackles, or rhonchi. No acute distress. Heart:  Regular rate and rhythm; no murmurs, clicks, rubs, or gallops. Abdomen:  Normal bowel sounds. Soft, non-tender and non-distended without masses, hepatosplenomegaly or hernias noted.  No guarding or rebound tenderness.   Rectal: Digital rectal exam confirmed posterior anal fissure Msk:  Symmetrical without gross deformities. Good, equal movement & strength bilaterally. Pulses:  Normal pulses noted. Extremities:  No clubbing or edema.  No cyanosis. Neurologic:  Alert and oriented x3;  grossly  normal neurologically. Skin:  Intact without significant lesions or rashes. No jaundice. Psych:  Alert and cooperative. Normal mood and affect.  Imaging Studies: Reviewed  Assessment and Plan:   Patricia Shields is a 53 y.o. female with obesity, psoriatic arthritis on methotrexate plus folic acid seen for follow-up of symptomatic hemorrhoids, history of tubular adenomas, GERD  History of tubular adenomas of colon Recommend surveillance colonoscopy in 2025  Anal fissure Recommend topical 0.125% nitroglycerin plus lidocaine, instructions provided  Symptomatic grade 2 hemorrhoids We will perform hemorrhoid ligation after healing of the anal fissure  Chronic constipation Continue high-fiber diet, fiber supplements Trial of Linzess  GERD She underwent EGD in 2016 which was unremarkable Discussed with her about antireflux lifestyle Continue Protonix 40 mg twice daily   Follow up in 4 weeks   Cephas Darby, MD

## 2019-01-18 ENCOUNTER — Encounter: Payer: Self-pay | Admitting: Gastroenterology

## 2019-02-14 ENCOUNTER — Encounter: Payer: Self-pay | Admitting: Gastroenterology

## 2019-02-25 ENCOUNTER — Ambulatory Visit: Payer: BC Managed Care – PPO | Admitting: Gastroenterology

## 2019-03-07 ENCOUNTER — Ambulatory Visit: Payer: BC Managed Care – PPO | Admitting: Gastroenterology

## 2019-03-19 ENCOUNTER — Other Ambulatory Visit: Payer: Self-pay | Admitting: *Deleted

## 2019-03-19 DIAGNOSIS — Z20822 Contact with and (suspected) exposure to covid-19: Secondary | ICD-10-CM

## 2019-03-21 LAB — NOVEL CORONAVIRUS, NAA: SARS-CoV-2, NAA: NOT DETECTED

## 2019-04-26 ENCOUNTER — Other Ambulatory Visit: Payer: Self-pay

## 2019-04-26 DIAGNOSIS — Z20822 Contact with and (suspected) exposure to covid-19: Secondary | ICD-10-CM

## 2019-04-29 LAB — NOVEL CORONAVIRUS, NAA: SARS-CoV-2, NAA: NOT DETECTED

## 2019-07-29 ENCOUNTER — Other Ambulatory Visit: Payer: Self-pay

## 2019-07-29 ENCOUNTER — Encounter (INDEPENDENT_AMBULATORY_CARE_PROVIDER_SITE_OTHER): Payer: Self-pay | Admitting: Vascular Surgery

## 2019-07-29 ENCOUNTER — Ambulatory Visit (INDEPENDENT_AMBULATORY_CARE_PROVIDER_SITE_OTHER): Payer: BLUE CROSS/BLUE SHIELD | Admitting: Vascular Surgery

## 2019-07-29 VITALS — BP 136/77 | HR 81 | Resp 12 | Ht 66.0 in | Wt 216.0 lb

## 2019-07-29 DIAGNOSIS — L4050 Arthropathic psoriasis, unspecified: Secondary | ICD-10-CM

## 2019-07-29 DIAGNOSIS — E782 Mixed hyperlipidemia: Secondary | ICD-10-CM

## 2019-07-29 DIAGNOSIS — M797 Fibromyalgia: Secondary | ICD-10-CM | POA: Diagnosis not present

## 2019-07-29 DIAGNOSIS — M79605 Pain in left leg: Secondary | ICD-10-CM

## 2019-07-29 DIAGNOSIS — M79604 Pain in right leg: Secondary | ICD-10-CM

## 2019-07-31 ENCOUNTER — Encounter (INDEPENDENT_AMBULATORY_CARE_PROVIDER_SITE_OTHER): Payer: Self-pay | Admitting: Vascular Surgery

## 2019-07-31 DIAGNOSIS — M79606 Pain in leg, unspecified: Secondary | ICD-10-CM | POA: Insufficient documentation

## 2019-07-31 NOTE — Progress Notes (Signed)
MRN : [id number]  Patricia Shields is a 54 y.o. (1966/04/05) female who presents with chief complaint of  Chief Complaint  Patient presents with  . New Patient (Initial Visit)    Jadali Cladudication  .  History of Present Illness:  The patient is seen for evaluation of painful lower extremities. Patient notes the pain is variable and not always associated with activity.  The pain is somewhat consistent day to day occurring on most days. The patient notes the pain also occurs with standing and routinely seems worse as the day wears on. The pain has been progressive over the past several years. The patient states these symptoms are causing  a profound negative impact on quality of life and daily activities and this is prompted her to seek further evaluation.  Patient has worsening psoriatic arthritis which affects multiple joints.  She has never had a in-depth evaluation of her cervical or lumbar spine.  She also has a longstanding history of fibromyalgia affecting multiple places and extremities.  The patient denies rest pain or dangling of an extremity off the side of the bed during the night for relief. No open wounds or sores at this time. No history of DVT or phlebitis. No prior interventions or surgeries.    Current Meds  Medication Sig  . amLODipine (NORVASC) 5 MG tablet   . B-D INSULIN SYRINGE 1CC/25G 25G X 5/8" 1 ML MISC USE TO INJECT MEDICATION ONCE PER WEEK AS DIRECTED  . B-D TB SYRINGE 1CC/25GX5/8" 25G X 5/8" 1 ML MISC USE TO INJECT MEDICATION ONCE A WEEK SUBCUTANEOUSLY  . clobetasol cream (TEMOVATE) 0.05 %   . cyclobenzaprine (FLEXERIL) 10 MG tablet Take 10 mg by mouth every 8 (eight) hours.  . fluticasone (FLONASE) 50 MCG/ACT nasal spray Place 2 sprays into both nostrils daily.  . folic acid (FOLVITE) 1 MG tablet Take 1 mg by mouth daily.  . hydrocortisone (ANUSOL-HC) 25 MG suppository UNWRAP AND INSERT ONE SUPPOSITORY INTO THE RECTUM TWICE A DAY  . ibuprofen  (ADVIL) 600 MG tablet Take 600 mg by mouth 3 (three) times daily.  . methotrexate 50 MG/2ML injection INJECT 0.8 ML ONCE A WEEK  . pantoprazole (PROTONIX) 40 MG tablet Take 40 mg by mouth daily.  . propranolol (INDERAL) 10 mg/mL   . sertraline (ZOLOFT) 100 MG tablet Take 100 mg by mouth daily.    . simvastatin (ZOCOR) 20 MG tablet   . zolpidem (AMBIEN) 5 MG tablet Take 5 mg by mouth at bedtime.    Past Medical History:  Diagnosis Date  . Anxiety   . Asthma    advair inhaler as needed  . Carpal tunnel syndrome    hand brace prn  . Fibroids   . GERD (gastroesophageal reflux disease)    occasional-no meds  . Hypercholesteremia   . Ovarian cyst, left     Past Surgical History:  Procedure Laterality Date  . ANKLE FRACTURE SURGERY    . APPENDECTOMY    . COLONOSCOPY WITH PROPOFOL N/A 01/04/2019   Procedure: COLONOSCOPY WITH PROPOFOL;  Surgeon: Lin Landsman, MD;  Location[facility] END[facility]COPY;  Service: Gastroenterology;  Laterality: N/A;  . CYSTECTOMY    . SALPINGOOPHORECTOMY  03/17/2011   Procedure: SALPINGO OOPHERECTOMY;  Surgeon: Marvene Staff, MD;  Location: [facility];  Service: Gynecology;  Laterality: Left;  . TONSILLECTOMY      Social History Social History   Tobacco Use  . Smoking status: Current Every Day Smoker    Packs/day:  0.50    Years: 20.00    Pack years: 10.00    Types: Cigarettes  . Smokeless tobacco: Never Used  Substance Use Topics  . Alcohol use: Yes    Comment: occ  . Drug use: No    Family History No family history of bleeding/clotting disorders, porphyria or autoimmune disease   Allergies  Allergen Reactions  . Bactrim   . Codeine Other (See Comments)    Reaction: causes accelerated heart rate   . Demerol Other (See Comments)    Reaction: causes accelerated heart rate   . Meperidine Palpitations     REVIEW OF SYSTEMS (Negative unless checked)  Constitutional: [] Weight loss  [] Fever  [] Chills Cardiac: [] Chest pain   [] Chest  pressure   [] Palpitations   [] Shortness of breath when laying flat   [] Shortness of breath with exertion. Vascular:  [x] Pain in legs with walking   [x] Pain in legs at rest  [] History of DVT   [] Phlebitis   [] Swelling in legs   [] Varicose veins   [] Non-healing ulcers Pulmonary:   [] Uses home oxygen   [] Productive cough   [] Hemoptysis   [] Wheeze  [] COPD   [] Asthma Neurologic:  [] Dizziness   [] Seizures   [] History of stroke   [] History of TIA  [] Aphasia   [] Vissual changes   [] Weakness or numbness in arm   [] Weakness or numbness in leg Musculoskeletal:   [x] Joint swelling   [x] Joint pain   [] Low back pain Hematologic:  [] Easy bruising  [] Easy bleeding   [] Hypercoagulable state   [] Anemic Gastrointestinal:  [] Diarrhea   [] Vomiting  [] Gastroesophageal reflux/heartburn   [] Difficulty swallowing. Genitourinary:  [] Chronic kidney disease   [] Difficult urination  [] Frequent urination   [] Blood in urine Skin:  [] Rashes   [] Ulcers  Psychological:  [] History of anxiety   []  History of major depression.  Physical Examination  Vitals:   07/29/19 1452  BP: 136/77  Pulse: 81  Resp: 12  Weight: 216 lb (98 kg)  Height: 5\' 6"  (1.676 m)   Body mass index is 34.86 kg/m. Gen: WD/WN, NAD Head: [place]/AT, No temporalis wasting.  Ear/Nose/Throat: Hearing grossly intact, nares w/o erythema or drainage, poor dentition Eyes: PER, EOMI, sclera nonicteric.  Neck: Supple, no masses.  No bruit or JVD.  Pulmonary:  Good air movement, clear to auscultation bilaterally, no use of accessory muscles.  Cardiac: RRR, normal S1, S2, no Murmurs. Vascular:  Vessel Right Left  Radial Palpable Palpable  PT Palpable Palpable  DP Palpable Palpable  Gastrointestinal: soft, non-distended. No guarding/no peritoneal signs.  Musculoskeletal: M/S 5/5 throughout.  No deformity or atrophy.  Neurologic: CN 2-12 intact. Pain and light touch intact in extremities.  Symmetrical.  Speech is fluent. Motor exam as listed above. Psychiatric:  Judgment intact, Mood & affect appropriate for pt's clinical situation. Dermatologic: No rashes or ulcers noted.  No changes consistent with cellulitis.  CBC Lab Results  Component Value Date   WBC 8.9 12/20/2018   HGB 13.7 12/20/2018   HCT 39.4 12/20/2018   MCV 99 (H) 12/20/2018   PLT 254 12/20/2018    BMET    Component Value Date/Time   NA 139 03/18/2011 0515   K 4.0 03/18/2011 0515   CL 107 03/18/2011 0515   CO2 25 03/18/2011 0515   GLUCOSE 138 (H) 03/18/2011 0515   BUN 5 (L) 03/18/2011 0515   CREATININE 0.50 03/18/2011 0515   CALCIUM 8.9 03/18/2011 0515   GFRNONAA >90 03/18/2011 0515   GFRAA >90 03/18/2011 0515   CrCl  cannot be calculated (Patient's most recent lab result is older than the maximum 21 days allowed.).  COAG No results found for: INR, PROTIME  Radiology No results found.   Assessment/Plan 1. Pain in both lower extremities Recommend:  I do not find evidence of Vascular pathology that would explain the patient's symptoms  The patient has atypical pain symptoms for vascular disease  I do not find evidence of Vascular pathology that would explain the patient's symptoms and I suspect the patient is c/o pseudoclaudication.  Patient should have an evaluation of his LS spine which I defer to the primary service.  Noninvasive studies including venous ultrasound of the legs do not identify vascular problems  The patient should continue walking and begin a more formal exercise program. The patient should continue his antiplatelet therapy and aggressive treatment of the lipid abnormalities.  Patient will follow-up with me on a PRN basis  Further work-up of her lower extremity pain is deferred to the primary service     2. Psoriatic arthritis ([employer]) See #1  3. Fibromyalgia, primary See #1  4. Mixed hyperlipidemia Continue statin as ordered and reviewed, no changes at this time    Hortencia Pilar, MD  07/31/2019 12:17 PM

## 2020-04-29 DIAGNOSIS — N899 Noninflammatory disorder of vagina, unspecified: Secondary | ICD-10-CM | POA: Insufficient documentation

## 2020-05-09 DIAGNOSIS — R000 Tachycardia, unspecified: Secondary | ICD-10-CM | POA: Insufficient documentation

## 2020-05-09 DIAGNOSIS — R002 Palpitations: Secondary | ICD-10-CM | POA: Insufficient documentation

## 2020-05-09 DIAGNOSIS — R718 Other abnormality of red blood cells: Secondary | ICD-10-CM | POA: Insufficient documentation

## 2020-06-07 DIAGNOSIS — J329 Chronic sinusitis, unspecified: Secondary | ICD-10-CM | POA: Insufficient documentation

## 2020-06-07 DIAGNOSIS — F418 Other specified anxiety disorders: Secondary | ICD-10-CM | POA: Insufficient documentation

## 2020-06-07 DIAGNOSIS — J3489 Other specified disorders of nose and nasal sinuses: Secondary | ICD-10-CM | POA: Insufficient documentation

## 2020-07-24 ENCOUNTER — Other Ambulatory Visit: Payer: Self-pay | Admitting: Neurology

## 2020-07-24 ENCOUNTER — Other Ambulatory Visit (HOSPITAL_COMMUNITY): Payer: Self-pay | Admitting: Neurology

## 2020-07-24 DIAGNOSIS — Z820 Family history of epilepsy and other diseases of the nervous system: Secondary | ICD-10-CM

## 2020-07-24 DIAGNOSIS — H93A3 Pulsatile tinnitus, bilateral: Secondary | ICD-10-CM

## 2020-07-24 DIAGNOSIS — R42 Dizziness and giddiness: Secondary | ICD-10-CM

## 2020-07-24 DIAGNOSIS — G43019 Migraine without aura, intractable, without status migrainosus: Secondary | ICD-10-CM

## 2020-07-27 ENCOUNTER — Other Ambulatory Visit: Payer: Self-pay | Admitting: Neurology

## 2020-07-27 DIAGNOSIS — G43019 Migraine without aura, intractable, without status migrainosus: Secondary | ICD-10-CM

## 2020-07-27 DIAGNOSIS — Z820 Family history of epilepsy and other diseases of the nervous system: Secondary | ICD-10-CM

## 2020-07-27 DIAGNOSIS — R42 Dizziness and giddiness: Secondary | ICD-10-CM

## 2020-07-27 DIAGNOSIS — H93A3 Pulsatile tinnitus, bilateral: Secondary | ICD-10-CM

## 2020-08-11 ENCOUNTER — Ambulatory Visit
Admission: RE | Admit: 2020-08-11 | Discharge: 2020-08-11 | Disposition: A | Discharge status: Home / Self Care | Payer: BC Managed Care – PPO | Source: Ambulatory Visit | Attending: Neurology | Admitting: Neurology

## 2020-08-11 ENCOUNTER — Other Ambulatory Visit: Payer: Self-pay

## 2020-08-11 ENCOUNTER — Ambulatory Visit: Payer: BC Managed Care – PPO

## 2020-08-11 DIAGNOSIS — G43019 Migraine without aura, intractable, without status migrainosus: Secondary | ICD-10-CM | POA: Diagnosis present

## 2020-08-11 DIAGNOSIS — Z820 Family history of epilepsy and other diseases of the nervous system: Secondary | ICD-10-CM

## 2020-08-11 DIAGNOSIS — R42 Dizziness and giddiness: Secondary | ICD-10-CM | POA: Insufficient documentation

## 2020-08-11 DIAGNOSIS — H93A3 Pulsatile tinnitus, bilateral: Secondary | ICD-10-CM

## 2020-08-11 MED ORDER — GADOBUTROL 1 MMOL/ML IV SOLN
10.0000 mL | Freq: Once | INTRAVENOUS | Status: AC | PRN
  Administered 2020-08-11: 10 mL via INTRAVENOUS

## 2020-09-28 ENCOUNTER — Other Ambulatory Visit: Payer: Self-pay | Admitting: Neurology

## 2020-09-28 DIAGNOSIS — M542 Cervicalgia: Secondary | ICD-10-CM

## 2020-10-03 ENCOUNTER — Ambulatory Visit
Admission: RE | Admit: 2020-10-03 | Discharge: 2020-10-03 | Disposition: A | Discharge status: Home / Self Care | Payer: BC Managed Care – PPO | Source: Ambulatory Visit | Attending: Neurology | Admitting: Neurology

## 2020-10-03 ENCOUNTER — Other Ambulatory Visit: Payer: Self-pay

## 2020-10-03 DIAGNOSIS — M542 Cervicalgia: Secondary | ICD-10-CM

## 2020-10-26 DIAGNOSIS — M5010 Cervical disc disorder with radiculopathy, unspecified cervical region: Secondary | ICD-10-CM | POA: Insufficient documentation

## 2020-10-27 ENCOUNTER — Other Ambulatory Visit (HOSPITAL_COMMUNITY): Payer: Self-pay | Admitting: Family Medicine

## 2020-10-27 ENCOUNTER — Other Ambulatory Visit: Payer: Self-pay | Admitting: Family Medicine

## 2020-10-27 DIAGNOSIS — G8929 Other chronic pain: Secondary | ICD-10-CM

## 2020-10-27 DIAGNOSIS — M549 Dorsalgia, unspecified: Secondary | ICD-10-CM

## 2020-10-27 DIAGNOSIS — M545 Low back pain, unspecified: Secondary | ICD-10-CM

## 2020-10-28 ENCOUNTER — Ambulatory Visit: Payer: BC Managed Care – PPO | Admitting: Family Medicine

## 2020-11-05 ENCOUNTER — Other Ambulatory Visit: Payer: BC Managed Care – PPO

## 2020-11-07 ENCOUNTER — Ambulatory Visit: Payer: BC Managed Care – PPO

## 2020-11-26 ENCOUNTER — Other Ambulatory Visit: Payer: Self-pay | Admitting: Orthopaedic Surgery

## 2020-11-26 DIAGNOSIS — M4726 Other spondylosis with radiculopathy, lumbar region: Secondary | ICD-10-CM

## 2020-11-26 DIAGNOSIS — M4724 Other spondylosis with radiculopathy, thoracic region: Secondary | ICD-10-CM

## 2020-12-06 ENCOUNTER — Ambulatory Visit
Admission: RE | Admit: 2020-12-06 | Discharge: 2020-12-06 | Disposition: A | Discharge status: Home / Self Care | Payer: BC Managed Care – PPO | Source: Ambulatory Visit | Attending: Orthopaedic Surgery | Admitting: Orthopaedic Surgery

## 2020-12-06 DIAGNOSIS — M4724 Other spondylosis with radiculopathy, thoracic region: Secondary | ICD-10-CM

## 2020-12-06 DIAGNOSIS — M4726 Other spondylosis with radiculopathy, lumbar region: Secondary | ICD-10-CM

## 2021-01-05 ENCOUNTER — Other Ambulatory Visit: Payer: Self-pay | Admitting: Urgent Care

## 2021-01-05 DIAGNOSIS — H538 Other visual disturbances: Secondary | ICD-10-CM

## 2021-01-23 ENCOUNTER — Ambulatory Visit
Admission: RE | Admit: 2021-01-23 | Discharge: 2021-01-23 | Disposition: A | Discharge status: Home / Self Care | Payer: BC Managed Care – PPO | Source: Ambulatory Visit | Attending: Urgent Care | Admitting: Urgent Care

## 2021-01-23 DIAGNOSIS — H538 Other visual disturbances: Secondary | ICD-10-CM

## 2021-01-23 MED ORDER — GADOBENATE DIMEGLUMINE 529 MG/ML IV SOLN
19.0000 mL | Freq: Once | INTRAVENOUS | Status: AC | PRN
  Administered 2021-01-23: 19 mL via INTRAVENOUS

## 2021-03-02 ENCOUNTER — Other Ambulatory Visit: Payer: Self-pay | Admitting: Internal Medicine

## 2021-03-02 DIAGNOSIS — R0600 Dyspnea, unspecified: Secondary | ICD-10-CM

## 2021-03-08 ENCOUNTER — Other Ambulatory Visit: Payer: Self-pay

## 2021-03-08 ENCOUNTER — Other Ambulatory Visit
Admission: RE | Admit: 2021-03-08 | Discharge: 2021-03-08 | Disposition: A | Discharge status: Home / Self Care | Payer: BC Managed Care – PPO | Source: Ambulatory Visit | Attending: Internal Medicine | Admitting: Internal Medicine

## 2021-03-08 DIAGNOSIS — Z20822 Contact with and (suspected) exposure to covid-19: Secondary | ICD-10-CM | POA: Insufficient documentation

## 2021-03-08 DIAGNOSIS — Z01812 Encounter for preprocedural laboratory examination: Secondary | ICD-10-CM | POA: Insufficient documentation

## 2021-03-08 LAB — SARS CORONAVIRUS 2 (TAT 6-24 HRS): SARS Coronavirus 2: NEGATIVE

## 2021-03-09 ENCOUNTER — Ambulatory Visit: Payer: BC Managed Care – PPO | Attending: Internal Medicine

## 2021-03-09 DIAGNOSIS — R0600 Dyspnea, unspecified: Secondary | ICD-10-CM | POA: Diagnosis not present

## 2021-03-09 MED ORDER — ALBUTEROL SULFATE (2.5 MG/3ML) 0.083% IN NEBU
2.5000 mg | INHALATION_SOLUTION | Freq: Once | RESPIRATORY_TRACT | Status: AC
  Administered 2021-03-09: 2.5 mg via RESPIRATORY_TRACT
  Filled 2021-03-09: qty 3

## 2021-08-26 DIAGNOSIS — M1990 Unspecified osteoarthritis, unspecified site: Secondary | ICD-10-CM | POA: Insufficient documentation

## 2021-08-26 DIAGNOSIS — L409 Psoriasis, unspecified: Secondary | ICD-10-CM | POA: Insufficient documentation

## 2021-08-26 DIAGNOSIS — D1030 Benign neoplasm of unspecified part of mouth: Secondary | ICD-10-CM | POA: Insufficient documentation

## 2021-08-26 DIAGNOSIS — T691XXA Chilblains, initial encounter: Secondary | ICD-10-CM | POA: Insufficient documentation

## 2021-08-26 DIAGNOSIS — M79643 Pain in unspecified hand: Secondary | ICD-10-CM | POA: Insufficient documentation

## 2021-08-26 DIAGNOSIS — H9313 Tinnitus, bilateral: Secondary | ICD-10-CM | POA: Insufficient documentation

## 2021-12-03 ENCOUNTER — Ambulatory Visit: Payer: BLUE CROSS/BLUE SHIELD | Admitting: Surgery

## 2021-12-20 ENCOUNTER — Ambulatory Visit: Payer: BLUE CROSS/BLUE SHIELD | Admitting: Surgery

## 2022-01-25 ENCOUNTER — Telehealth: Payer: Self-pay | Admitting: Gastroenterology

## 2022-01-25 NOTE — Telephone Encounter (Signed)
Pt need to schedule f/u colonoscopy

## 2022-01-25 NOTE — Telephone Encounter (Signed)
Patient is not due for repeat coloscopy till 12/2023.  Called patient and informed patient of this information.

## 2022-02-18 ENCOUNTER — Encounter: Payer: Self-pay | Admitting: Psychiatry

## 2022-02-18 ENCOUNTER — Ambulatory Visit (INDEPENDENT_AMBULATORY_CARE_PROVIDER_SITE_OTHER): Payer: BC Managed Care – PPO | Admitting: Psychiatry

## 2022-02-18 VITALS — BP 133/83 | HR 70 | Ht 66.0 in | Wt 210.8 lb

## 2022-02-18 DIAGNOSIS — F411 Generalized anxiety disorder: Secondary | ICD-10-CM | POA: Diagnosis not present

## 2022-02-18 DIAGNOSIS — F410 Panic disorder [episodic paroxysmal anxiety] without agoraphobia: Secondary | ICD-10-CM

## 2022-02-18 MED ORDER — ESCITALOPRAM OXALATE 5 MG PO TABS: ORAL_TABLET | ORAL | 1 refills | Status: DC

## 2022-02-18 MED ORDER — ALPRAZOLAM 0.25 MG PO TABS: ORAL_TABLET | ORAL | 1 refills | Status: DC

## 2022-02-18 NOTE — Progress Notes (Signed)
[facility] MD/PA/NP Initial Note  02/18/2022 12:11 PM Patricia Shields  MRN:  [id number]  Chief Complaint:  Chief Complaint   Anxiety     HPI: Pt is a 56 yo female being seen for initial evaluation for anxiety, insomnia, and history of depression. She reports that she is referred to this office by a friend. She reports that she has experienced some anxiety in response to husband having medical issues. She report that in Oct 2022 husband "had a cyst come up on the back of his head which "turned into metastatic lung cancer" and has metastasized to his bones and liver. She reports "I did very well for awhile" in terms of coping with this diagnosis and that it is now affecting her more. She reports that her husband also had prostate cancer several years ago.   She reports several major losses with her Mother dying in 2008 with dementia, her Father dying suddenly from an [place] in 2010, and her brother dying in 2013 from an overdose. She reports that father's MVA "put me down for a couple of years." She reports that today would have been her father's 100th birthday. She reports that after her father's death she was not able to leave the house and had to work from home. She had a few trips to ER for heart palpitations and numbness in arms which were attributed to anxiety. Father was taken to [facility] when his MVA occurred and hospital staff were unable to identify him for a half day. Hospital staff located the neighbor who provided her home number, cell number, and husband's number. She reports that all of their phones rang in the middle of the night from the highway patrol saying they needed to contact a nurse at [facility]. She reports that she continues to startle when she hears a loud phone ring. "I try not to think about it." She reports that she has some sadness and memories of her father when she sees a car like her father's. Denies re-experiencing or nightmares related to her father's death. Occ  re-experiencing of traumatic events around husband's diagnoses and health issues.   She does not recall anxiety in childhood or her twenties. She reports that she had some anxiety starting in her 39's when father was diagnosed with bladder cancer and recalls feeling "outside of myself." She reports that she feels like she cannot get enough accomplished. She reports thinking about what she needs to do and wanting to "fix" situations.   She reports that her anxiety "levels are up and down." She reports that she has been "irritable, short-fused" and this is not typical for her. She reports in the evenings she feels "heavy-chested." She reports that her thoughts are "non-stop" in the morning. She reports feeling like she is "waiting for the next shoe to drop." She reports feeling compelled to check on husband and ask him how he is doing. She reports frequent worry about her husband. She reports frequent "over-thinking" usually in the mornings and evenings. Denies intrusive thoughts. She has some chest tightness in the evenings when she gets home. She has some GI upset, headache, and muscle tension. She reports that she has had panic attacks in the past and estimates about a dozen panic attacks in the last month.   She reports some checking behaviors. She reports that she has to check the lock on her car 4 times at night. Denies excessive handwashing. She reports that she has always been neat and preferred her house being tidy.  Denies rituals, routines, or compulsive counting/grouping.   Less tolerant of crowds and noises. She reports exaggerated startle response with noises or people approaching her quickly. She had recurrent ear infections in childhood. She reports mysophonia. "I'm craving quiet and peace." Denies sensitivity to bright lights or textures.   Denies current depressed mood. She reports that earlier in the year she would wake up crying. Denies persistent sad mood. She reports that she has occ  sadness. Sleeping well with Lunesta. She reports that she rests well and "it takes awhile to let go" and relax. She and her husband were sleeping in different rooms and are now sleeping in the same bed. She notices some anxiety if she hears him breathing differently. She reports that her appetite has fluctuated and she has lost about 10 lbs recently. She reports that her energy and motivation have been good. She has recently noticed more difficulty with concentration. She reports that she is easily distracted. Denies anhedonia or diminished interests. Denies current or past SI.  Husband is going through medical issues. Reports that husband has a positive outlook and is "head-strong" and motivated towards his treatments. Reports husband has always been very active.   Denies periods of decreased need for sleep. Denies excessive energy, increased goal-directed activity, impulsivity, or risky behavior. Denies elevated mood.   Born in [place] and raised in this area. Reports that parents were "great" and father was [employer]. She was close with both parents. Has a sister that is 78 years older. Sister has MS. Brother was 1 years older and died in 08/08/11 from overdose. She reports that they had frequent conflict. She did one year of college. 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 Married since 08-Aug-1999 when she was 57 yo. She reports that she and her husband have been together almost 84 years. She has a step-son that she considers her son since he was 5 years old when she met her husband. Son has twin sons. Daughter-in-law has given other family members limited contact with grandchildren. She reports having supportive friends and sister-in-laws. She reports that her faith and prayer is important to her. She enjoys fashion, decorating, the beach, and the lake.   Past Psychiatric Medication Trials: Lunesta- Effective and  well-tolerated Ambien- Effective for sleep. Felt medicated the next day Trazodone- Ineffective Lexapro- Has been helpful for anxiety. She started on 10 mg and went to 15 mg and felt "shakier."  Sertraline- Took 100 mg long-term. May have helped and then no longer seemed to be effective. Denies side effects Citalopram- Not helpful for anxiety.  Cymbalta- Adverse reaction- "felt like I was coming out of my skin" Buspar-increased irritability Hydroxyzine- caused irritability Gabapentin- Prescribed but did not take Topamax Alprazolam-Helpful for panic. Denies side effects.   Visit Diagnosis: No diagnosis found.  Past Psychiatric History: Has seen Dr. Nicolasa Ducking for psychiatric treatment for the last 6 months. Has started therapy recently with Jenne Campus with [facility]. Saw a therapist after her father's death. Denies any psychiatric hospitalization.    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 ENDOSCOPY;  Service: Gastroenterology;  Laterality: N/A;   CYSTECTOMY     SALPINGOOPHORECTOMY  03/17/2011   Procedure: SALPINGO OOPHERECTOMY;  Surgeon: Marvene Staff, MD;  Location: [facility];  Service: Gynecology;  Laterality: Left;   TONSILLECTOMY      Family Psychiatric History: Brother died of overdose. She thinks brother had Bipolar Disorder and substance abuse. Maternal great-uncle may have have some psychiatric  issues.   Family History:  Family History  Problem Relation Age of Onset   Dementia Mother    Raynaud syndrome Mother    Bladder Cancer Father    Diabetes Father    Multiple sclerosis Sister     Social History:  Social History   Socioeconomic History   Marital status: Married    Spouse name: Not on file   Number of children: Not on file   Years of education: Not on file   Highest education level: Not on file  Occupational History   Not on file  Tobacco Use   Smoking status: Every Day    Packs/day: 0.50    Years: 20.00    Total pack years: 10.00    Types: Cigarettes   Smokeless tobacco: Never   Tobacco comments:    Attempting to stop smoking  Substance and Sexual Activity   Alcohol use: Yes    Comment: occ   Drug use: No   Sexual activity: Yes  Other Topics Concern   Not on file  Social History Narrative   Not on file   Social Determinants of Health   Financial Resource Strain: Not on file  Food Insecurity: Not on file  Transportation Needs: Not on file  Physical Activity: Not on file  Stress: Not on file  Social Connections: Not on file    Allergies:  Allergies  Allergen Reactions   Bactrim    Codeine Other (See Comments)    Reaction: causes accelerated heart rate    Demerol Other (See Comments)    Reaction: causes accelerated heart rate    Meperidine Palpitations    Metabolic Disorder Labs: Lab Results  Component Value Date   HGBA1C 5.7 (H) 12/20/2018   No results found for: "PROLACTIN" Lab Results  Component Value Date   CHOL 230 (H) 12/20/2018   TRIG 265 (H) 12/20/2018   HDL 46 12/20/2018   LDLCALC 131 (H) 12/20/2018   No results found for: "TSH"  Therapeutic Level Labs: No results found for: "LITHIUM" No results found for: "VALPROATE" No results found for: "CBMZ"  Current Medications: Current Outpatient Medications  Medication Sig Dispense Refill   ALPRAZolam (XANAX) 0.5 MG tablet TAKE ONE TAB BY MOUTH UP TO THREE TIMES DAILY  AS NEEDED FOR ACUTE ANXIETY     atenolol (TENORMIN) 25 MG tablet Take 25 mg by mouth daily.     baclofen (LIORESAL) 10 MG tablet Take 1 tablet by mouth 3 (three) times daily as needed.     CALCIUM-VITAMIN D PO Take by mouth.     clobetasol cream (TEMOVATE) 0.05 %      escitalopram (LEXAPRO) 10 MG tablet Take 15 mg by mouth every morning.     Eszopiclone 3 MG  TABS Take 3 mg by mouth at bedtime as needed.     fluticasone (FLONASE) 50 MCG/ACT nasal spray 1-2 sprays     fluticasone-salmeterol (ADVAIR DISKUS) 250-50 MCG/ACT AEPB INHALE 1 PUFF INTO THE LUNGS TWICE A DAY FOR 30 DAYS     hydrochlorothiazide (HYDRODIURIL) 25 MG tablet Take 25 mg by mouth daily.     pantoprazole (PROTONIX) 40 MG tablet Take 40 mg by mouth daily.     potassium chloride (KLOR-CON M) 10 MEQ tablet Take 1 tablet by mouth daily.     simvastatin (ZOCOR) 20 MG tablet Take 1 tablet by mouth daily.     amLODipine (NORVASC) 5 MG tablet  (Patient not taking: Reported on 02/18/2022)     No current facility-administered medications for this visit.    Medication Side Effects: none  Orders placed this visit:  No orders of the defined types were placed in this encounter.   Psychiatric Specialty Exam:  Review of Systems  Constitutional: Negative.   HENT: Negative.    Eyes: Negative.   Respiratory: Negative.    Cardiovascular: Negative.   Gastrointestinal: Negative.   Endocrine: Negative.   Genitourinary: Negative.   Musculoskeletal:  Positive for arthralgias, back pain, myalgias and neck pain.  Skin: Negative.   Allergic/Immunologic: Negative.   Neurological: Negative.   Hematological: Negative.   Psychiatric/Behavioral:         Please refer to HPI    Last menstrual period 02/21/2011.There is no height or weight on file to calculate BMI.  General Appearance: Casual, Neat, and Well Groomed  Eye Contact:  Good  Speech:  Clear and Coherent and Normal Rate  Volume:  Normal  Mood:  Anxious and Sad in response to  husband's health issues  Affect:  Appropriate, Congruent, Full Range, and Tearful at times when discussing losses and husband's illness  Thought Process:  Coherent, Goal Directed, Linear, and Descriptions of Associations: Intact  Orientation:  Full (Time, Place, and Person)  Thought Content: Logical and Hallucinations: None   Suicidal Thoughts:  No  Homicidal Thoughts:  No  Memory:  WNL  Judgement:  Good  Insight:  Good  Psychomotor Activity:  Normal  Concentration:  Concentration: Good and Attention Span: Good  Recall:  Good  Fund of Knowledge: Good  Language: Good  Assets:  Communication Skills Desire for Improvement Resilience Social Support  ADL's:  Intact  Cognition: WNL  Prognosis:  Good    Receiving Psychotherapy: Yes   Treatment Plan/Recommendations: Pt seen for 60 minutes and time spent reviewing social, medical, and family history as well as responses to past medication trials. She reports that Lexapro has been helpful for her anxiety overall, however she continues to have anxiety symptoms that are bothersome particularly in the mornings and evenings. She reports that she noticed some increased anxiety when she was started on Lexapro and it was increased to 15 mg daily after taking 10 mg daily for 1-2 weeks. Discussed that increased anxiety can occur with SSRI's with rapid titration and that risk for anxiety is usually lower with slower and smaller increases in dosage. Discussed that increased anxiety after an increase in an SSRI typically resolves after about 10 days. She agrees to re-trial of increase in Lexapro. Discussed option of increasing Lexapro to 12.5 mg daily for one month, then increasing to 15 mg daily as tolerated. Pt is in agreement with this plan. Will send in script for Lexapro 5 mg tabs to take in combination with current supply of 10 mg tabs,  ie. Take one 10 mg and 1/2 of a 5 mg tab to equal 12.5 mg and then one 10 mg tab and one 5 mg tab to equal 15 mg.  Recommend continuing Lunesta 3 mg po QHS for insomnia. Will re-start Alprazolam prn for anxiety and panic. She reports that she was prescribed 0.5 mg tabs in the past and this seemed "too strong." Will send in script for Alprazolam 0.25 mg 1/2-1 tab po BID prn anxiety or panic. Advised not to take Alprazolam less than 4 hours before taking Lunesta. Recommend continuing psychotherapy.  Pt to follow-up with this provider in 8 weeks or sooner if clinically indicated. Patient advised to contact office with any questions, adverse effects, or acute worsening in signs and symptoms.   Thayer Headings, PMHNP

## 2022-02-23 ENCOUNTER — Institutional Professional Consult (permissible substitution): Payer: BC Managed Care – PPO | Admitting: Pulmonary Disease

## 2022-03-07 ENCOUNTER — Telehealth: Payer: Self-pay | Admitting: Psychiatry

## 2022-03-07 NOTE — Telephone Encounter (Signed)
Pt called at 1:35p but let no message.  I called back. She wanted to advise JC that she is up to 12.5 mg (0.5 of '5mg'$  + '10mg'$ ) of the Lexapro.  She said it's doing "OK".  She plasn to increase to '15mg'$  at the end of October.  She also wants to know if we have gotten her records from Dr Nicolasa Ducking in [place] so she can cancel her appts from Nov with their office.  No upcoming appts scheduled.

## 2022-03-07 NOTE — Telephone Encounter (Signed)
Please review

## 2022-03-08 NOTE — Telephone Encounter (Signed)
Pt stated we were suppose to send a record request to Dr Varney Biles office if we haven't already

## 2022-03-12 ENCOUNTER — Other Ambulatory Visit: Payer: Self-pay | Admitting: Psychiatry

## 2022-03-12 DIAGNOSIS — F410 Panic disorder [episodic paroxysmal anxiety] without agoraphobia: Secondary | ICD-10-CM

## 2022-03-12 DIAGNOSIS — F411 Generalized anxiety disorder: Secondary | ICD-10-CM

## 2022-03-15 ENCOUNTER — Other Ambulatory Visit: Payer: Self-pay

## 2022-03-15 ENCOUNTER — Telehealth: Payer: Self-pay | Admitting: Psychiatry

## 2022-03-15 MED ORDER — ESZOPICLONE 3 MG PO TABS: 3.0000 mg | ORAL_TABLET | Freq: Every evening | ORAL | 0 refills | Status: DC | PRN

## 2022-03-15 NOTE — Telephone Encounter (Signed)
Pt called requesting Rx for Lunesta 3 mg [facility] [address]. New Pt apt was 9/29 Pt # [phone]

## 2022-03-15 NOTE — Telephone Encounter (Signed)
Pended.

## 2022-03-21 ENCOUNTER — Encounter (INDEPENDENT_AMBULATORY_CARE_PROVIDER_SITE_OTHER): Payer: Self-pay

## 2022-03-21 ENCOUNTER — Institutional Professional Consult (permissible substitution): Payer: BC Managed Care – PPO | Admitting: Pulmonary Disease

## 2022-04-05 ENCOUNTER — Telehealth: Payer: Self-pay | Admitting: Psychiatry

## 2022-04-05 ENCOUNTER — Other Ambulatory Visit: Payer: Self-pay

## 2022-04-05 DIAGNOSIS — F410 Panic disorder [episodic paroxysmal anxiety] without agoraphobia: Secondary | ICD-10-CM

## 2022-04-05 DIAGNOSIS — F411 Generalized anxiety disorder: Secondary | ICD-10-CM

## 2022-04-05 MED ORDER — ESCITALOPRAM OXALATE 5 MG PO TABS: ORAL_TABLET | ORAL | 0 refills | Status: DC

## 2022-04-05 MED ORDER — ESCITALOPRAM OXALATE 10 MG PO TABS: 10.0000 mg | ORAL_TABLET | Freq: Every day | ORAL | 0 refills | Status: DC

## 2022-04-05 NOTE — Telephone Encounter (Signed)
Patient lvm at 11:01 wanting to confirm is she is supposed to increase her Lexapro to '15mg'$  has been on 12 1/2 for about a month and a half. She needs a prescription for '10mg'$  [facility]apro [facility] to [facility] [address] Ph: [phone] Appt 11/29

## 2022-04-05 NOTE — Telephone Encounter (Signed)
RX SENT

## 2022-04-12 ENCOUNTER — Telehealth: Payer: Self-pay | Admitting: Psychiatry

## 2022-04-12 NOTE — Telephone Encounter (Signed)
LVM to rtc 

## 2022-04-12 NOTE — Telephone Encounter (Signed)
Patient lvm stating her spouse is having surgery today so she'll be at [facility]. She is on a Zpac but was advised the Lexapro combination may cause heart palpitations. Please advise.  Follow up appointment scheduled for 11/29 Contact [phone]

## 2022-04-12 NOTE — Telephone Encounter (Signed)
Please let her know that there is a potential for this combination to cause QT prolongation (changes seen on EKG). She may have been advised to let someone know if she had heart palpitations in the event she experienced this. It seems the likelihood of this happening is low since to my knowledge she does not have a h/o QT prolongation and a Z-pack is usually a very short duration. It should be ok to combine short -term and to let her providers know if she experienced any cardiac symptoms. The other option would be to request an alternative antibiotic since she would likely have discontinuation from Lexapro if she stopped it for a few days and then re-started it.

## 2022-04-12 NOTE — Telephone Encounter (Signed)
Please review

## 2022-04-13 NOTE — Telephone Encounter (Signed)
Pt informed

## 2022-04-17 ENCOUNTER — Other Ambulatory Visit: Payer: Self-pay | Admitting: Psychiatry

## 2022-04-20 ENCOUNTER — Telehealth (INDEPENDENT_AMBULATORY_CARE_PROVIDER_SITE_OTHER): Payer: BC Managed Care – PPO | Admitting: Psychiatry

## 2022-04-20 ENCOUNTER — Encounter: Payer: Self-pay | Admitting: Psychiatry

## 2022-04-20 DIAGNOSIS — F411 Generalized anxiety disorder: Secondary | ICD-10-CM | POA: Diagnosis not present

## 2022-04-20 DIAGNOSIS — G4700 Insomnia, unspecified: Secondary | ICD-10-CM | POA: Diagnosis not present

## 2022-04-20 DIAGNOSIS — F410 Panic disorder [episodic paroxysmal anxiety] without agoraphobia: Secondary | ICD-10-CM | POA: Diagnosis not present

## 2022-04-20 MED ORDER — ESZOPICLONE 3 MG PO TABS: 3.0000 mg | ORAL_TABLET | Freq: Every evening | ORAL | 5 refills | Status: DC | PRN

## 2022-04-20 MED ORDER — ALPRAZOLAM 0.25 MG PO TABS: ORAL_TABLET | ORAL | 1 refills | Status: DC

## 2022-04-20 MED ORDER — ESCITALOPRAM OXALATE 5 MG PO TABS: 5.0000 mg | ORAL_TABLET | Freq: Every day | ORAL | 0 refills | Status: DC

## 2022-04-20 MED ORDER — ESCITALOPRAM OXALATE 10 MG PO TABS: 10.0000 mg | ORAL_TABLET | Freq: Every day | ORAL | 0 refills | Status: DC

## 2022-04-20 NOTE — Progress Notes (Signed)
Patricia Shields [id number] 01-31-1966 56 y.o.  Virtual Visit via Video Note  I connected with pt @ on 04/20/22 at 12:45 PM EST by a video enabled telemedicine application and verified that I am speaking with the correct person using two identifiers.   I discussed the limitations of evaluation and management by telemedicine and the availability of in person appointments. The patient expressed understanding and agreed to proceed.  I discussed the assessment and treatment plan with the patient. The patient was provided an opportunity to ask questions and all were answered. The patient agreed with the plan and demonstrated an understanding of the instructions.   The patient was advised to call back or seek an in-person evaluation if the symptoms worsen or if the condition fails to improve as anticipated.  I provided 25 minutes of non-face-to-face time during this encounter.  The patient was located at  work in [facility].  The provider was located at [place].   Thayer Headings, PMHNP   Subjective:   Patient ID:  Patricia Shields is a 56 y.o. (DOB 09-Oct-1965) female.  Chief Complaint:  Chief Complaint  Patient presents with   Anxiety    HPI Patricia Shields presents for follow-up of depression and anxiety.  She reports that she "struggled" during recent events. She reports that she has been taking Lexapro and is now up to 15 mg for a couple of weeks. She reports that she usually has most of her anxiety later in the day and that it is not as "intense, but some nights I have it worse than others." She reports that panic attacks are no longer happening daily and are now about twice a week. She reports that she has been taking Xanax prn some nights when she has panic. She reports that she has handled some recent stressors better than she would have expected. Husband had a femur fracture where his cancer was located. She reports that husband had surgery in the same hospital where  her father died and this did not upset her as much as she expected. She reports that she has more worry when she first wakes up. She reports that worry is not worse. She reports that she has had a few incidents where she felt "jittery" and this has been sporadic. She reports that she feels jittery "only on occasion." She continues to prefer to be alone and avoid social situations.   She denies depressed mood. She reports that her energy and motivation has been lower since staying in the hospital with her husband. She reports that energy and motivation have improved some. She has returned to work after being out 2 weeks. She reports that her sleep is "ok" with Lunesta and occasional middle of the night awakenings. Slept about 7 hours last night. Appetite has fluctuated. Concentration has been ok. Denies SI.   Xanax last filled 03/29/22 x 2. Lunesta last filled on 04/18/22  Past Psychiatric Medication Trials: Lunesta- Effective and well-tolerated Ambien- Effective for sleep. Felt medicated the next day Trazodone- Ineffective Lexapro- Has been helpful for anxiety. She started on 10 mg and went to 15 mg and felt "shakier."  Sertraline- Took 100 mg long-term. May have helped and then no longer seemed to be effective. Denies side effects Citalopram- Not helpful for anxiety.  Cymbalta- Adverse reaction- "felt like I was coming out of my skin" Buspar-increased irritability Hydroxyzine- caused irritability Gabapentin- Prescribed but did not take Topamax Alprazolam-Helpful for panic. Denies side effects.    Review of  Systems:  Review of Systems  Gastrointestinal: Negative.   Musculoskeletal:  Negative for gait problem.  Neurological:  Negative for headaches.  Psychiatric/Behavioral:         Please refer to HPI    Medications: I have reviewed the patient's current medications.  Current Outpatient Medications  Medication Sig Dispense Refill   atenolol (TENORMIN) 25 MG tablet Take 25 mg by mouth  daily.     baclofen (LIORESAL) 10 MG tablet Take 1 tablet by mouth 3 (three) times daily as needed.     CALCIUM-VITAMIN D PO Take by mouth.     fluticasone (FLONASE) 50 MCG/ACT nasal spray as needed.     fluticasone-salmeterol (ADVAIR DISKUS) 250-50 MCG/ACT AEPB INHALE 1 PUFF INTO THE LUNGS TWICE A DAY FOR 30 DAYS     hydrochlorothiazide (HYDRODIURIL) 25 MG tablet Take 25 mg by mouth daily.     pantoprazole (PROTONIX) 40 MG tablet Take 40 mg by mouth daily.     simvastatin (ZOCOR) 20 MG tablet Take 1 tablet by mouth daily.     [START ON 04/26/2022] ALPRAZolam (XANAX) 0.25 MG tablet Take 1/2-1 tab po BID prn severe anxiety or panic 60 tablet 1   amLODipine (NORVASC) 5 MG tablet  (Patient not taking: Reported on 02/18/2022)     clobetasol cream (TEMOVATE) 0.05 %      escitalopram (LEXAPRO) 10 MG tablet Take 1 tablet (10 mg total) by mouth daily. TAKE WITH A 5 MG TABLET TO EQUAL TOTAL DOSE OF 15 MG DAILY 90 tablet 0   escitalopram (LEXAPRO) 5 MG tablet Take 1 tablet (5 mg total) by mouth daily. TAKE WITH A 10 MG TABLET TO EQUAL TOTAL DOSE OF 15 MG DAILY 90 tablet 0   [START ON 05/16/2022] Eszopiclone 3 MG TABS Take 1 tablet (3 mg total) by mouth at bedtime as needed. 30 tablet 5   potassium chloride (KLOR-CON M) 10 MEQ tablet Take 1 tablet by mouth daily. (Patient not taking: Reported on 04/20/2022)     No current facility-administered medications for this visit.    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, left     Family History  Problem Relation Age of Onset   Dementia Mother    Raynaud syndrome Mother    Bladder Cancer Father    Diabetes Father    Multiple sclerosis Sister     Social History   Socioeconomic History   Marital status: Married    Spouse name: Not on file   Number of children: Not on file   Years of education: Not on file   Highest education level: Not on file  Occupational History   Not on file  Tobacco Use   Smoking status: Every Day    Packs/day: 0.50    Years: 20.00  Total pack years: 10.00    Types: Cigarettes   Smokeless tobacco: Never   Tobacco comments:    Attempting to stop smoking  Substance and Sexual Activity   Alcohol use: Yes    Comment: occ   Drug use: No   Sexual activity: Yes  Other Topics Concern   Not on file  Social History Narrative   Not on file   Social Determinants of Health   Financial Resource Strain: Not on file  Food Insecurity: Not on file  Transportation Needs: Not on file  Physical Activity: Not on file  Stress: Not on file  Social Connections: Not on file  Intimate Partner Violence: Not on file    Past Medical History, Surgical history, Social history, and Family history were reviewed and updated as appropriate.   Please see review of systems for further details on the patient's review from today.   Objective:   Physical Exam:  LMP 02/21/2011   Physical Exam Constitutional:      General: She is not in acute distress. Musculoskeletal:        General: No deformity.  Neurological:     Mental Status: She is alert and oriented to person, place, and time.     Coordination: Coordination normal.  Psychiatric:        Attention and Perception: Attention and perception normal. She does not perceive auditory or visual hallucinations.        Mood and Affect: Affect is not labile, blunt, angry or inappropriate.         Speech: Speech normal.        Behavior: Behavior normal.        Thought Content: Thought content normal. Thought content is not paranoid or delusional. Thought content does not include homicidal or suicidal ideation. Thought content does not include homicidal or suicidal plan.        Cognition and Memory: Cognition and memory normal.        Judgment: Judgment normal.     Comments: Insight intact Mood is appropriate to content and affect is congruent     Lab Review:     Component Value Date/Time   NA 139 03/18/2011 0515   K 4.0 03/18/2011 0515   CL 107 03/18/2011 0515   CO2 25 03/18/2011 0515   GLUCOSE 138 (H) 03/18/2011 0515   BUN 5 (L) 03/18/2011 0515   CREATININE 0.50 03/18/2011 0515   CALCIUM 8.9 03/18/2011 0515   GFRNONAA >90 03/18/2011 0515   GFRAA >90 03/18/2011 0515       Component Value Date/Time   WBC 8.9 12/20/2018 1549   WBC 11.9 (H) 03/18/2011 0515   RBC 3.97 12/20/2018 1549   RBC 3.30 (L) 03/18/2011 0515   HGB 13.7 12/20/2018 1549   HCT 39.4 12/20/2018 1549   PLT 254 12/20/2018 1549   MCV 99 (H) 12/20/2018 1549   MCH 34.5 (H) 12/20/2018 1549   MCH 30.6 03/18/2011 0515   MCHC 34.8 12/20/2018 1549   MCHC 32.4 03/18/2011 0515   RDW 11.9 12/20/2018 1549    No results found for: "POCLITH", "LITHIUM"   No results found for: "PHENYTOIN", "PHENOBARB", "VALPROATE", "CBMZ"   .res Assessment: Plan:    Pt seen for 25 minutes and time spent discussing response to increase in Lexapro. She report that she has tolerated gradual increase in Lexapro and has been taking Lexapro 15 mg po qd for about 2 weeks with some improvement in anxiety. Agreed to continue Lexapro 15 mg po qd at  this time and that she may continue to experience further benefit in response to dose increase. She reports that she prefers to take a 10 mg tab and a 5 mg tab to equal 15 mg dose.  Will continue Xanax 0.25 mg 1/2-1 tab po BID prn severe anxiety or panic.  Continue Lunesta 3 mg po QHS prn  insomnia.  Pt to follow-up with this provider in 6 weeks or sooner if clinically indicated.  Patient advised to contact office with any questions, adverse effects, or acute worsening in signs and symptoms.   Jawanda was seen today for anxiety.  Diagnoses and all orders for this visit:  Generalized anxiety disorder -     escitalopram (LEXAPRO) 5 MG tablet; Take 1 tablet (5 mg total) by mouth daily. TAKE WITH A 10 MG TABLET TO EQUAL TOTAL DOSE OF 15 MG DAILY -     escitalopram (LEXAPRO) 10 MG tablet; Take 1 tablet (10 mg total) by mouth daily. TAKE WITH A 5 MG TABLET TO EQUAL TOTAL DOSE OF 15 MG DAILY  Panic disorder -     escitalopram (LEXAPRO) 5 MG tablet; Take 1 tablet (5 mg total) by mouth daily. TAKE WITH A 10 MG TABLET TO EQUAL TOTAL DOSE OF 15 MG DAILY -     escitalopram (LEXAPRO) 10 MG tablet; Take 1 tablet (10 mg total) by mouth daily. TAKE WITH A 5 MG TABLET TO EQUAL TOTAL DOSE OF 15 MG DAILY -     ALPRAZolam (XANAX) 0.25 MG tablet; Take 1/2-1 tab po BID prn severe anxiety or panic  Insomnia, unspecified type -     Eszopiclone 3 MG TABS; Take 1 tablet (3 mg total) by mouth at bedtime as needed.     Please see After Visit Summary for patient specific instructions.  No future appointments.  No orders of the defined types were placed in this encounter.     -------------------------------

## 2022-04-28 ENCOUNTER — Telehealth: Payer: Self-pay | Admitting: Psychiatry

## 2022-04-28 NOTE — Telephone Encounter (Addendum)
Please let her know that Diflucan has a similar interaction with Lexapro as the Z-Pack where it could cause QT prolongation. The risk is considered minimal, since like a z-pack, the course is so short. It would be more of a concern if she had a history of QT prolongation, cardiac issues, or was starting it daily long-term. Agree that she should notify providers if she experiences palpitations or any cardiac symptoms with the combination.

## 2022-04-28 NOTE — Telephone Encounter (Signed)
Please advise 

## 2022-04-28 NOTE — Telephone Encounter (Signed)
Last appt was 04/20/22. Kelly is on Lexapro 15 mg. Her gynecologist called in a RX for Diflucan for a yeast infection but the pharmacist told her that it could cause minimal heart palpitations with Lexapro. She also previously had a Z-Pak prescribed but the pharmacist told her that this would also cause heart palpitations. She would like to know if it would be safe to take these with the Lexapro? Her phone number is [phone].

## 2022-04-28 NOTE — Telephone Encounter (Signed)
Pt informed

## 2022-06-05 IMAGING — MR MR MRA NECK WO/W CM
3 of 4 series · 36 of 48 positions shown · IV contrast (multihance)
Comparison: Comparison made with prior brain MRI from 08/11/2020.

CLINICAL DATA: Initial evaluation for dizziness with whooshing
sound in the ears.

EXAM:
MRA HEAD WITHOUT CONTRAST
MRA NECK WITHOUT AND WITH CONTRAST
TECHNIQUE: Angiographic images of the Circle of Willis were acquired using MRA
technique without intravenous contrast. Angiographic images of the
neck were acquired using MRA technique without and with intravenous
contrast. Carotid stenosis measurements (when applicable) are
obtained utilizing NASCET criteria, using the distal internal
carotid diameter as the denominator.
CONTRAST:  19mL MULTIHANCE GADOBENATE DIMEGLUMINE 529 MG/ML IV SOLN

[Series 7: tof_2d neck non · axial · 3.0mm · 0.65mm/px · z∈[-74,+85]mm · 10 of 80 slices shown]
[im 1/80]
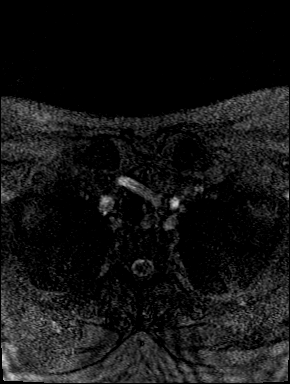
[im 9/80]
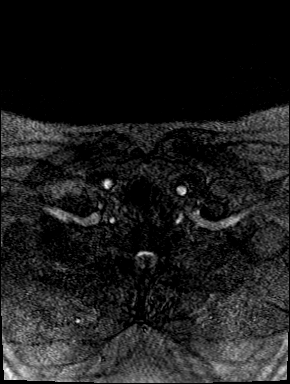
[im 18/80]
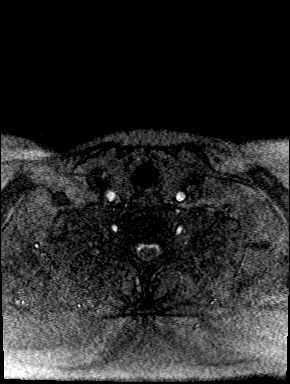
[im 27/80]
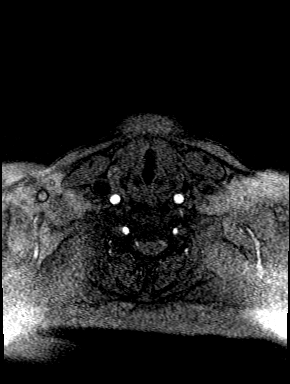
[im 36/80]
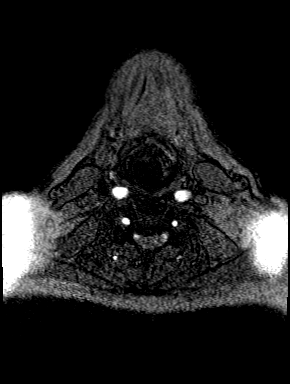
[im 44/80]
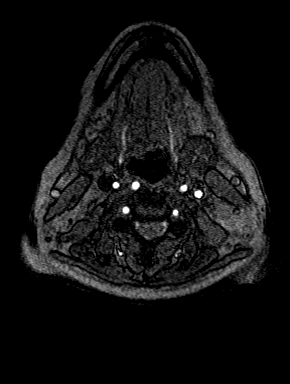
[im 53/80]
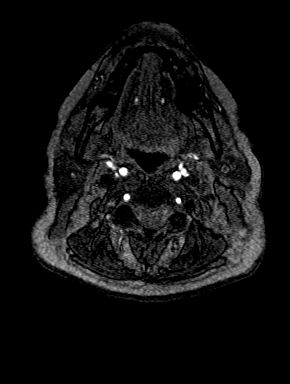
[im 62/80]
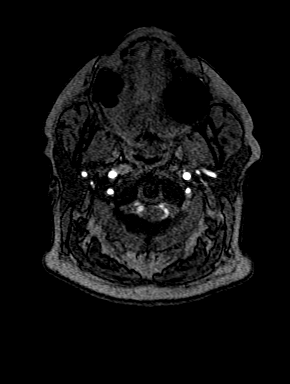
[im 71/80]
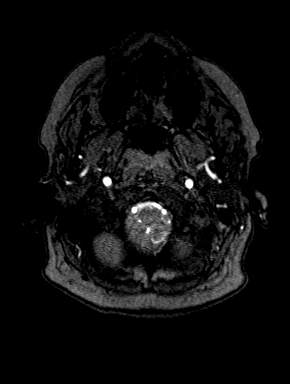
[im 80/80]
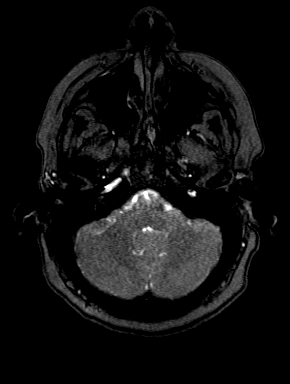

[Series 15: angio_fl3d_cor_post · coronal · 0.8mm · 0.85mm/px · 13 of 104 slices shown]
[im 1/104]
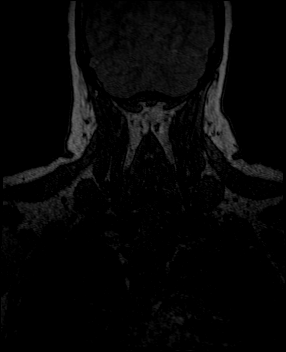
[im 9/104]
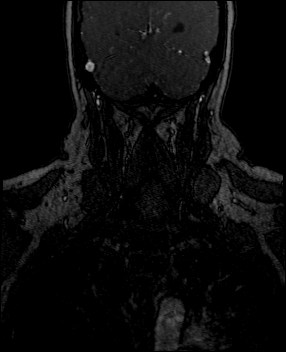
[im 18/104]
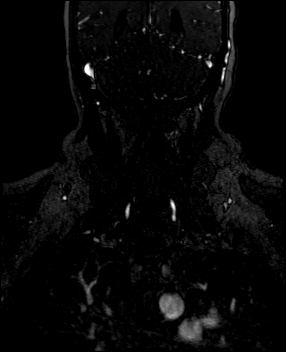
[im 26/104]
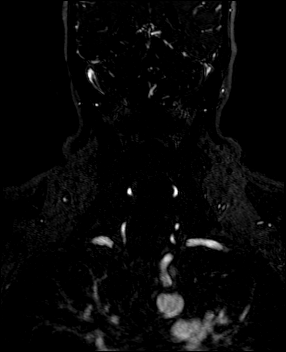
[im 35/104]
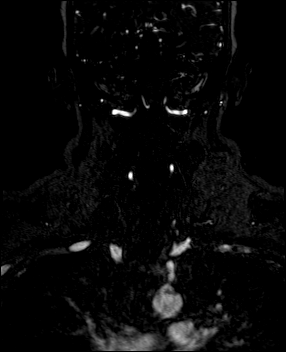
[im 43/104]
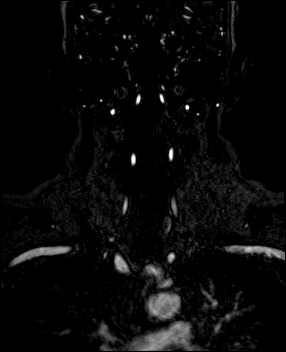
[im 52/104]
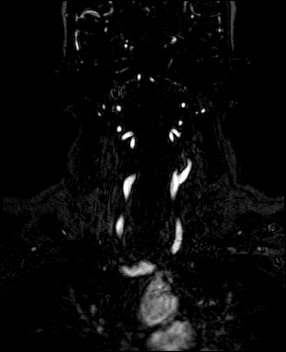
[im 61/104]
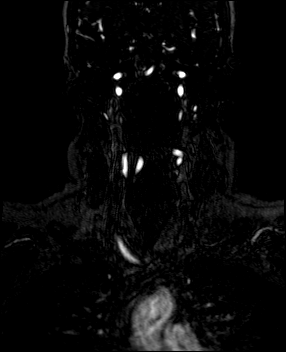
[im 69/104]
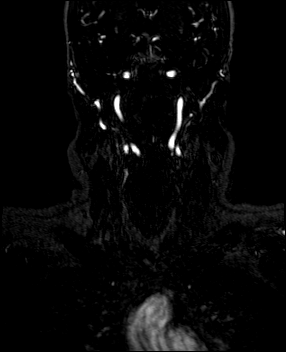
[im 78/104]
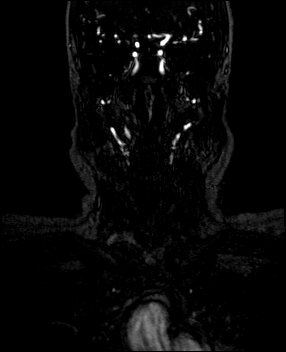
[im 86/104]
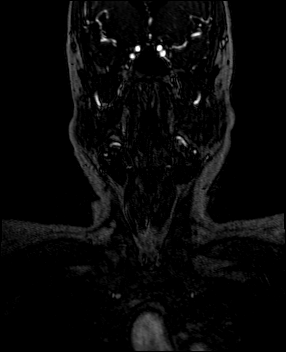
[im 95/104]
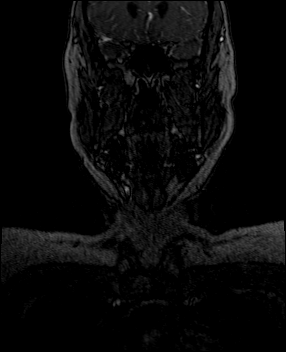
[im 104/104]
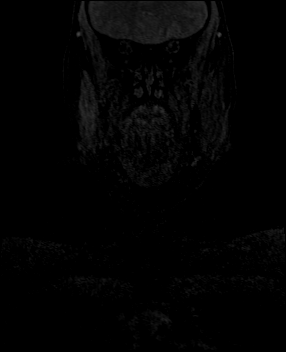

[Series 16: angio_fl3d_cor_post_sub · coronal · 0.8mm · 0.85mm/px · 13 of 104 slices shown]
[im 1/104]
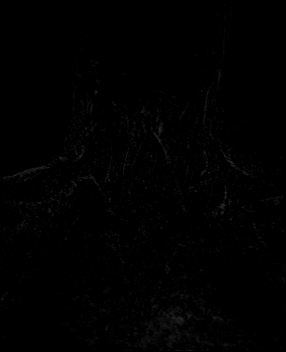
[im 9/104]
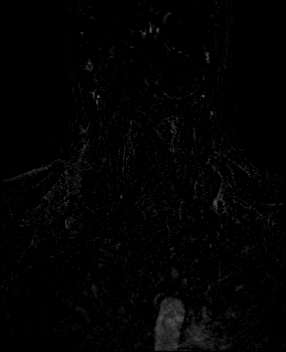
[im 18/104]
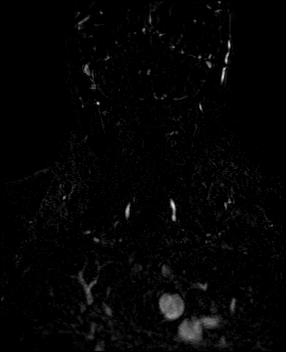
[im 26/104]
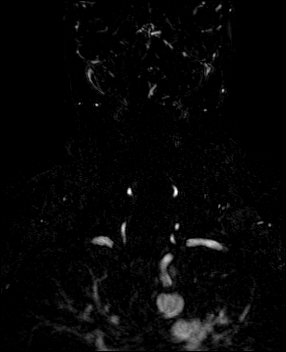
[im 35/104]
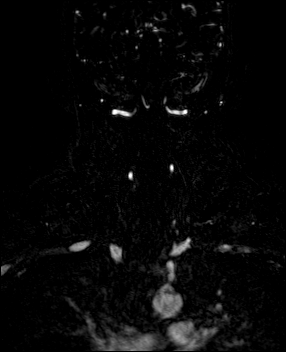
[im 43/104]
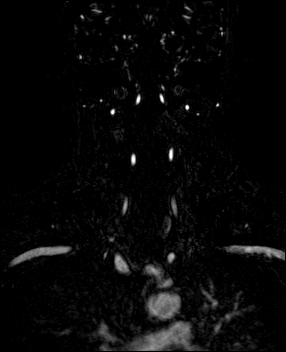
[im 52/104]
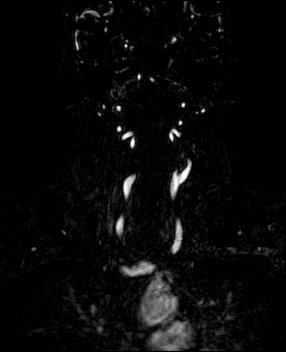
[im 61/104]
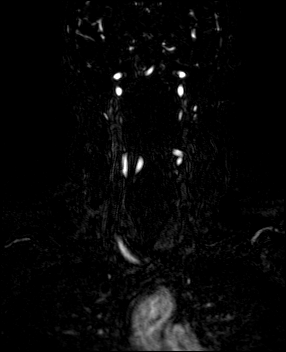
[im 69/104]
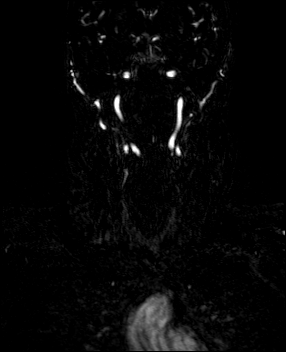
[im 78/104]
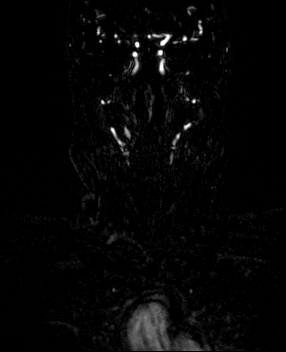
[im 86/104]
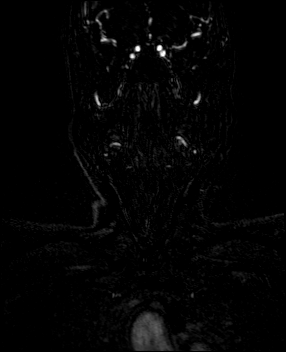
[im 95/104]
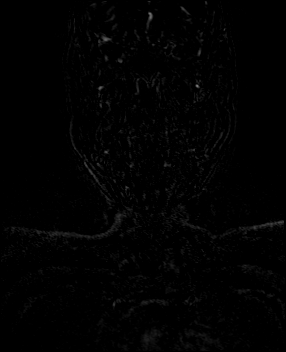
[im 104/104]
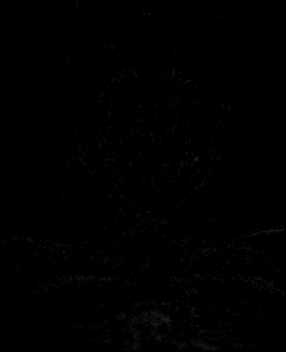

[36 of 48 positions shown; findings below may reference images not displayed]

FINDINGS: MRA HEAD FINDINGS

Anterior circulation: Both internal carotid arteries widely patent
to the termini without stenosis. A1 segments widely patent. Normal
anterior communicating artery complex. Both anterior cerebral
arteries widely patent to their distal aspects without stenosis. No
M1 stenosis or occlusion. Normal MCA bifurcations. Distal MCA
branches well perfused and symmetric.

Posterior circulation: Both vertebral arteries widely patent to the
vertebrobasilar junction without stenosis. Left vertebral artery
dominant. Both PICA origins patent and normal. Basilar patent to its
distal aspect without stenosis. Superior cerebellar arteries patent
bilaterally. Right PCA supplied via the basilar. Predominant fetal
type origin of the left PCA supplied via a hypoplastic left P1
segment and robust left posterior communicating artery. Both PCAs
well perfused to their distal aspects without stenosis.

Anatomic variants: Predominant fetal type origin of the left PCA. No
intracranial aneurysm or other vascular abnormality.

MRA NECK FINDINGS

Aortic arch: Examination mildly degraded by motion artifact.

Visualized aortic arch normal in caliber with normal branch pattern.
No hemodynamically significant stenosis or other abnormality about
the origin of the great vessels.

Right carotid system: Right common and internal carotid arteries
widely patent without stenosis, evidence for dissection or
occlusion.

Left carotid system: Left common and internal carotid arteries
widely patent without stenosis, evidence for dissection or
occlusion.

Vertebral arteries: Both vertebral arteries arise from the
subclavian arteries. No proximal subclavian artery stenosis. Right
vertebral artery slightly dominant. Both vertebral arteries widely
patent without stenosis, evidence for dissection or occlusion.

Other: None
IMPRESSION: Normal MRA of the head and neck. No findings to explain patient's
symptoms identified.

## 2022-06-06 ENCOUNTER — Telehealth (INDEPENDENT_AMBULATORY_CARE_PROVIDER_SITE_OTHER): Payer: BC Managed Care – PPO | Admitting: Psychiatry

## 2022-06-06 ENCOUNTER — Encounter: Payer: Self-pay | Admitting: Psychiatry

## 2022-06-06 DIAGNOSIS — G4700 Insomnia, unspecified: Secondary | ICD-10-CM

## 2022-06-06 DIAGNOSIS — F411 Generalized anxiety disorder: Secondary | ICD-10-CM | POA: Diagnosis not present

## 2022-06-06 DIAGNOSIS — F410 Panic disorder [episodic paroxysmal anxiety] without agoraphobia: Secondary | ICD-10-CM

## 2022-06-06 MED ORDER — ESCITALOPRAM OXALATE 5 MG PO TABS: 5.0000 mg | ORAL_TABLET | Freq: Every day | ORAL | 0 refills | Status: DC

## 2022-06-06 MED ORDER — ESCITALOPRAM OXALATE 10 MG PO TABS: 10.0000 mg | ORAL_TABLET | Freq: Every day | ORAL | 0 refills | Status: DC

## 2022-06-06 NOTE — Progress Notes (Signed)
Patricia Shields [id number] 08-06-1965 57 y.o.  Virtual Visit via Video Note  I connected with pt @ on 06/06/22 at  1:00 PM EST by a video enabled telemedicine application and verified that I am speaking with the correct person using two identifiers.   I discussed the limitations of evaluation and management by telemedicine and the availability of in person appointments. The patient expressed understanding and agreed to proceed.  I discussed the assessment and treatment plan with the patient. The patient was provided an opportunity to ask questions and all were answered. The patient agreed with the plan and demonstrated an understanding of the instructions.   The patient was advised to call back or seek an in-person evaluation if the symptoms worsen or if the condition fails to improve as anticipated.  I provided 35 minutes of non-face-to-face time during this encounter.  The patient was located in her personal vehicle outside of work.  The provider was located at home.   Thayer Headings, PMHNP   Subjective:   Patient ID:  Patricia Shields is a 57 y.o. (DOB [birth year]) female.  Chief Complaint:  Chief Complaint  Patient presents with   Follow-up    Anxiety, Insomnia    HPI Patricia Shields presents for follow-up of anxiety. She reports, "I really feel like the increase in Lexapro has helped." She reports that her anxiety has been less overall. She reports that some days she has anxiety and feels tightness in her chest. She reports that she can control her thoughts more.   She reports that on weekends she has increased energy and motivation. She reports, "I work myself to the bone... I have limited time to do things. I won't stop, even though I want to physically stop." She reports that she has been needing to do more since husband is less physically able. She reports that she has been obsessive at times with cleaning. She reports that she has been able to rest some weekends.  Denies any other impulsive behavior.   She reports that she has "been down some days." She has some irritability. She has been taking Xanax less often since it now seems to make her more irritable. She reports that she has frustration since husband has been frustrated that progress has been slower than expected. She reports that she has not been taking care of herself recently. She reports, "I don't think I am going to bed early enough." She reports that she may need to take Lunesta earlier and go to bed earlier. She reports that her energy and motivation have been "ok." Appetite has been ok. Concentration is adequate. Denies SI.   Husband has "not been doing well." They were not able to see some family  members over Christmas due to illness.   She reports that she has started taking Lexapro after lunch instead of breakfast since she tends to eat a full lunch. She tends to eat a minimal breakfast. She reports that she feels less jittery when taking Lexapro with more food.   Lunesta last filled 05/18/22.  Past Psychiatric Medication Trials: Lunesta- Effective and well-tolerated Ambien- Effective for sleep. Felt medicated the next day Trazodone- Ineffective Lexapro- Has been helpful for anxiety. She started on 10 mg and went to 15 mg and felt "shakier."  Sertraline- Took 100 mg long-term. May have helped and then no longer seemed to be effective. Denies side effects Citalopram- Not helpful for anxiety.  Cymbalta- Adverse reaction- "felt like I was coming out of my  skin" Buspar-increased irritability Hydroxyzine- caused irritability Gabapentin- Prescribed but did not take Topamax Alprazolam-Helpful for panic. Denies side effects.  Review of Systems:  Review of Systems  Gastrointestinal: Negative.   Endocrine:       She reports that her Hgb A1C was recently elevated.  Musculoskeletal:  Negative for gait problem.  Neurological:  Negative for tremors.  Psychiatric/Behavioral:         Please  refer to HPI    Medications: I have reviewed the patient's current medications.  Current Outpatient Medications  Medication Sig Dispense Refill   atenolol (TENORMIN) 25 MG tablet Take 25 mg by mouth daily.     baclofen (LIORESAL) 10 MG tablet Take 1 tablet by mouth 3 (three) times daily as needed.     Cholecalciferol (VITAMIN D3) 1.25 MG (50000 UT) CAPS Take 1 capsule by mouth once a week.     Eszopiclone 3 MG TABS Take 1 tablet (3 mg total) by mouth at bedtime as needed. 30 tablet 5   fluticasone (FLONASE) 50 MCG/ACT nasal spray as needed.     fluticasone-salmeterol (ADVAIR DISKUS) 250-50 MCG/ACT AEPB INHALE 1 PUFF INTO THE LUNGS TWICE A DAY FOR 30 DAYS     hydrochlorothiazide (HYDRODIURIL) 25 MG tablet Take 25 mg by mouth daily.     pantoprazole (PROTONIX) 40 MG tablet Take 40 mg by mouth daily.     simvastatin (ZOCOR) 20 MG tablet Take 1 tablet by mouth daily.     ALPRAZolam (XANAX) 0.25 MG tablet Take 1/2-1 tab po BID prn severe anxiety or panic 60 tablet 1   amLODipine (NORVASC) 5 MG tablet  (Patient not taking: Reported on 02/18/2022)     clobetasol cream (TEMOVATE) 0.05 %      escitalopram (LEXAPRO) 10 MG tablet Take 1 tablet (10 mg total) by mouth daily. TAKE WITH A 5 MG TABLET TO EQUAL TOTAL DOSE OF 15 MG DAILY 90 tablet 0   escitalopram (LEXAPRO) 5 MG tablet Take 1 tablet (5 mg total) by mouth daily. TAKE WITH A 10 MG TABLET TO EQUAL TOTAL DOSE OF 15 MG DAILY 90 tablet 0   No current facility-administered medications for this visit.    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, left     Family History  Problem Relation Age of Onset   Dementia Mother    Raynaud syndrome Mother    Bladder Cancer Father    Diabetes Father    Multiple sclerosis Sister     Social History   Socioeconomic History   Marital status: Married    Spouse name: Not on file   Number of children: Not on file   Years of education: Not on file   Highest education level: Not on file  Occupational History   Not on  file  Tobacco Use   Smoking status: Every Day    Packs/day: 0.50    Years: 20.00    Total pack years: 10.00    Types: Cigarettes   Smokeless tobacco: Never   Tobacco comments:    Attempting to stop smoking  Substance and Sexual Activity   Alcohol use: Yes    Comment: occ   Drug use: No   Sexual activity: Yes  Other Topics Concern   Not on file  Social History Narrative   Not on file   Social Determinants of Health   Financial Resource Strain: Not on file  Food Insecurity: Not on file  Transportation Needs: Not on file  Physical Activity: Not on file  Stress: Not on file  Social Connections: Not on file  Intimate Partner Violence: Not on file    Past Medical History, Surgical history, Social history, and Family history were reviewed and updated as appropriate.   Please see review of systems for further details on the patient's review from today.   Objective:   Physical Exam:  LMP 02/21/2011   Physical Exam Neurological:     Mental Status: She is alert and oriented to person, place, and time.     Cranial Nerves: No dysarthria.  Psychiatric:        Attention and Perception: Attention and perception normal.        Mood and Affect: Mood is not depressed.        Speech: Speech normal.        Behavior: Behavior is cooperative.        Thought Content: Thought content  normal. Thought content is not paranoid or delusional. Thought content does not include homicidal or suicidal ideation. Thought content does not include homicidal or suicidal plan.        Cognition and Memory: Cognition and memory normal.        Judgment: Judgment normal.     Comments: Insight intact Mood presents as mildly anxious     Lab Review:     Component Value Date/Time   NA 139 03/18/2011 0515   K 4.0 03/18/2011 0515   CL 107 03/18/2011 0515   CO2 25 03/18/2011 0515   GLUCOSE 138 (H) 03/18/2011 0515   BUN 5 (L) 03/18/2011 0515   CREATININE 0.50 03/18/2011 0515   CALCIUM 8.9 03/18/2011 0515   GFRNONAA >90 03/18/2011 0515   GFRAA >90 03/18/2011 0515       Component Value Date/Time   WBC 8.9 12/20/2018 1549   WBC 11.9 (H) 03/18/2011 0515   RBC 3.97 12/20/2018 1549   RBC 3.30 (L) 03/18/2011 0515   HGB 13.7 12/20/2018 1549   HCT 39.4 12/20/2018 1549   PLT 254 12/20/2018 1549   MCV 99 (H) 12/20/2018 1549   MCH 34.5 (H) 12/20/2018 1549   MCH 30.6 03/18/2011 0515   MCHC 34.8 12/20/2018 1549   MCHC 32.4 03/18/2011 0515   RDW 11.9 12/20/2018 1549    No results found for: "POCLITH", "LITHIUM"   No results found for: "PHENYTOIN", "PHENOBARB", "VALPROATE", "CBMZ"   .res Assessment: Plan:   Pt seen for 35 minutes and time spent discussing response to increase in Lexapro. She reports that overall anxiety has improved and she is no longer having panic attacks. She reports that she may be cleaning obsessively on the weekends and has started to set up parameters around cleaning- ie., will only clean one day on the weekend and rest the other day and will not clean past  a certain time.  Encouraged pt to track blood sugar as directed by PCP and discussed that fluctuations in glucose may affect energy and mood.  Will continue Lexapro 15 mg po qd for anxiety.  Continue Lunesta 3 mg po QHS for insomnia.  Pt has not used Alprazolam prn recently. Refill remains on file for pt to use  as needed for panic.  Pt to follow-up in 3 months or sooner if clinically indicated.  Patient advised to contact office with any questions, adverse effects, or acute worsening in signs and symptoms.  Zaynab was seen today for follow-up.  Diagnoses and all orders for this visit:  Generalized anxiety disorder -     escitalopram (LEXAPRO) 5 MG tablet; Take 1 tablet (5 mg total) by mouth daily. TAKE WITH A 10 MG TABLET TO EQUAL TOTAL DOSE OF 15 MG DAILY -     escitalopram (LEXAPRO) 10 MG tablet; Take 1 tablet (10 mg total) by mouth daily. TAKE WITH A 5 MG TABLET TO EQUAL TOTAL DOSE OF 15 MG DAILY  Panic disorder -     escitalopram (LEXAPRO) 5 MG tablet; Take 1 tablet (5 mg total) by mouth daily. TAKE WITH A 10 MG TABLET TO EQUAL TOTAL DOSE OF 15 MG DAILY -     escitalopram (LEXAPRO) 10 MG tablet; Take 1 tablet (10 mg total) by mouth daily. TAKE WITH A 5 MG TABLET TO EQUAL TOTAL DOSE OF 15 MG DAILY  Insomnia, unspecified type     Please see After Visit Summary for patient specific instructions.  No future appointments.   No orders of the defined types were placed in this encounter.     -------------------------------

## 2022-06-28 ENCOUNTER — Other Ambulatory Visit: Payer: Self-pay | Admitting: Psychiatry

## 2022-06-28 DIAGNOSIS — F410 Panic disorder [episodic paroxysmal anxiety] without agoraphobia: Secondary | ICD-10-CM

## 2022-09-15 ENCOUNTER — Ambulatory Visit: Payer: BC Managed Care – PPO | Admitting: Psychiatry

## 2022-09-27 ENCOUNTER — Ambulatory Visit (INDEPENDENT_AMBULATORY_CARE_PROVIDER_SITE_OTHER): Payer: 59 | Admitting: Psychiatry

## 2022-09-27 ENCOUNTER — Encounter: Payer: Self-pay | Admitting: Psychiatry

## 2022-09-27 DIAGNOSIS — F410 Panic disorder [episodic paroxysmal anxiety] without agoraphobia: Secondary | ICD-10-CM

## 2022-09-27 DIAGNOSIS — G4700 Insomnia, unspecified: Secondary | ICD-10-CM

## 2022-09-27 DIAGNOSIS — F411 Generalized anxiety disorder: Secondary | ICD-10-CM

## 2022-09-27 MED ORDER — ESCITALOPRAM OXALATE 5 MG PO TABS: ORAL_TABLET | ORAL | 1 refills | Status: DC

## 2022-09-27 MED ORDER — LORAZEPAM 0.5 MG PO TABS: ORAL_TABLET | ORAL | 1 refills | Status: DC

## 2022-09-27 MED ORDER — ESZOPICLONE 3 MG PO TABS: 3.0000 mg | ORAL_TABLET | Freq: Every evening | ORAL | 5 refills | Status: DC | PRN

## 2022-09-27 NOTE — Progress Notes (Signed)
SHANET SHARIFF [id number] [birth year] 57 y.o.  Subjective:   Patricia Shields ID:  Patricia Shields is a 57 y.o. (DOB [birth year]) female.  Chief Complaint:  Chief Complaint  Patricia Shields presents with   Anxiety   Other    Irritability   Depression    HPI Patricia Shields presents to the office today for follow-up of anxiety, depression, and insomnia. Patricia Shields reports that her husband has been having continued health issues and they met with palliative care last week. Patricia Shields reports that palliative care informed them that he may not fully recover to baseline after having femur surgery. Patricia Shields reports that her mood has been lower since then.   "I feel like my anxiety is more controlled... I'm more irritated." Patricia Shields reports that Patricia Shields has been "more short-fused." Patricia Shields reports that husband has some testing this week. Patricia Shields reports that he is tolerating cancer treatments, but that he appears to have severe pain- "It's hard to see him struggle everyday." Patricia Shields reports that mother's day may also be bringing up some different emotions. Patricia Shields denies depressed mood- "Not really sad, but more emotional."   Patricia Shields reports, "I think I have symptoms of shaking in the past." Patricia Shields reports that Patricia Shields has episodes of anger where Patricia Shields will feel flushed and shaky. Patricia Shields also will feel short of breath and have incresaed HR. Patricia Shields reports that these episodes typically occur every evening and periodically in the day. Patricia Shields is not sure if this is a form of panic. Patricia Shields reports that Patricia Shields has been crying more. Patricia Shields reports feel like like Patricia Shields is waiting for the next shoe to drop. Patricia Shields repots exaggerated startle response. "I can't relax." Patricia Shields reports, "smaller things aren't as important."  Patricia Shields reports that Patricia Shields may have irritation when people are discussing minor issues. Patricia Shields reports feeling, "like I can't get everything done, and I want to."   Patricia Shields reports that Xanax "makes me more irritable." Patricia Shields reports that Patricia Shields "is not getting the calmness" from Xanax.   Patricia Shields  reports difficulty sleeping through the night some nights. "I have a hard time going to bed sometimes." Appetite has been good. Patricia Shields reports feeling more "tired." Motivation is good. Denies difficulty with concentration. Denies SI.   Had to put 78 yo cat down recently. Reports that her cat was a source of support and comfort.    Lunesta last filled 09/13/22.   Past Psychiatric Medication Trials: Lunesta- Effective and well-tolerated Ambien- Effective for sleep. Felt medicated the next day Trazodone- Ineffective Lexapro- Has been helpful for anxiety. Patricia Shields started on 10 mg  Sertraline- Took 100 mg long-term. May have helped and then no longer seemed to be effective. Denies side effects Citalopram- Not helpful for anxiety.  Cymbalta- Adverse reaction- "felt like I was coming out of my skin" Buspar-increased irritability Hydroxyzine- caused irritability Gabapentin- Prescribed but did not take Topamax Alprazolam-Helpful for panic. Denies side effects. Lorazepam  Review of Systems:  Review of Systems  HENT:  Positive for dental problem.   Musculoskeletal:  Negative for gait problem.  Psychiatric/Behavioral:         Please refer to HPI    Medications: I have reviewed the Patricia Shields's current medications.  Current Outpatient Medications  Medication Sig Dispense Refill   atenolol (TENORMIN) 25 MG tablet Take 25 mg by mouth daily.     baclofen (LIORESAL) 10 MG tablet Take 1 tablet by mouth 3 (three) times daily as needed.     escitalopram (LEXAPRO) 10 MG tablet Take 1 tablet (10 mg  total) by mouth daily. TAKE WITH A 5 MG TABLET TO EQUAL TOTAL DOSE OF 15 MG DAILY 90 tablet 0   fluticasone (FLONASE) 50 MCG/ACT nasal spray as needed.     fluticasone-salmeterol (ADVAIR DISKUS) 250-50 MCG/ACT AEPB INHALE 1 PUFF INTO THE LUNGS TWICE A DAY FOR 30 DAYS     hydrochlorothiazide (HYDRODIURIL) 25 MG tablet Take 25 mg by mouth daily.     LORazepam (ATIVAN) 0.5 MG tablet Take 1/2-1 tab po every 6 hours as  needed for anxiety 45 tablet 1   pantoprazole (PROTONIX) 40 MG tablet Take 40 mg by mouth daily.     simvastatin (ZOCOR) 20 MG tablet Take 1 tablet by mouth daily.     amLODipine (NORVASC) 5 MG tablet  (Patricia Shields not taking: Reported on 02/18/2022)     Cholecalciferol (VITAMIN D3) 1.25 MG (50000 UT) CAPS Take 1 capsule by mouth once a week. (Patricia Shields not taking: Reported on 09/27/2022)     clobetasol cream (TEMOVATE) 0.05 %      escitalopram (LEXAPRO) 5 MG tablet TAKE 1.5 TABLETS WITH A 10 MG TABLET (TOTAL DOSE OF 17.5 MG) FOR 2 WEEKS, THEN INCREASE TO 2 TABLETS DAILY WITH A 10 MG TABLET AS TOLERATED 60 tablet 1   [START ON 11/08/2022] Eszopiclone 3 MG TABS Take 1 tablet (3 mg total) by mouth at bedtime as needed. 30 tablet 5   No current facility-administered medications for this visit.    Medication Side Effects: Other: Possible increased irritability with Xanax  Allergies:  Allergies  Allergen Reactions   Bactrim    Codeine Other (See Comments)    Reaction: causes accelerated heart rate    Demerol Other (See Comments)    Reaction: causes accelerated heart rate    Meperidine Palpitations    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   Please see review of systems for further details on the Patricia Shields's review from today.   Objective:   Physical Exam:  BP 106/63   Pulse 65   LMP 02/21/2011   Physical Exam Constitutional:      General: Patricia Shields is not in acute distress. Musculoskeletal:        General: No deformity.  Neurological:     Mental Status:  Patricia Shields is alert and oriented to person, place, and time.     Coordination: Coordination normal.  Psychiatric:        Attention and Perception: Attention and perception normal. Patricia Shields does not perceive auditory or visual hallucinations.        Mood and Affect: Mood is anxious and depressed. Affect is not labile, blunt, angry or inappropriate.        Speech: Speech normal.        Behavior: Behavior normal.        Thought Content: Thought content normal. Thought content is not paranoid or delusional. Thought content does not include homicidal or suicidal ideation. Thought content does not include homicidal or suicidal plan.  Cognition and Memory: Cognition and memory normal.        Judgment: Judgment normal.     Comments: Insight intact     Lab Review:     Component Value Date/Time   NA 139 03/18/2011 0515   K 4.0 03/18/2011 0515   CL 107 03/18/2011 0515   CO2 25 03/18/2011 0515   GLUCOSE 138 (H) 03/18/2011 0515   BUN 5 (L) 03/18/2011 0515   CREATININE 0.50 03/18/2011 0515   CALCIUM 8.9 03/18/2011 0515   GFRNONAA >90 03/18/2011 0515   GFRAA >90 03/18/2011 0515       Component Value Date/Time   WBC 8.9 12/20/2018 1549   WBC 11.9 (H) 03/18/2011 0515   RBC 3.97 12/20/2018 1549   RBC 3.30 (L) 03/18/2011 0515   HGB 13.7 12/20/2018 1549   HCT 39.4 12/20/2018 1549   PLT 254 12/20/2018 1549   MCV 99 (H) 12/20/2018 1549   MCH 34.5 (H) 12/20/2018 1549   MCH 30.6 03/18/2011 0515   MCHC 34.8 12/20/2018 1549   MCHC 32.4 03/18/2011 0515   RDW 11.9 12/20/2018 1549    No results found for: "POCLITH", "LITHIUM"   No results found for: "PHENYTOIN", "PHENOBARB", "VALPROATE", "CBMZ"   .res Assessment: Plan:    Discussed that Patricia Shields is experiencing similar symptoms to when Patricia Shields initially presented for treatment and that increase in Lexapro was initially helpful for these symptoms. Discussed potential benefits of increasing Lexapro since Patricia Shields has had a partial response to lower doses of  Lexapro. Patricia Shields agrees to increase in Lexapro. Discussed increasing dose by small increments over a longer period of time since Patricia Shields has felt shaky in the past when Lexapro was increased quickly. Will increase Lexapro to 17.5 mg po qd for 2 weeks, then increase to 20 mg po qd for anxiety and depression.  Discussed option of increasing Xanax dose to previous dose of 0.5 mg or switching to another medication. Discussed potential benefits, risks, and side effects of Lorazepam. Reviewed potential side effects of benzodiazepines to include potential risk of tolerance and dependence, as well as possible drowsiness.  Advised Patricia Shields not to drive if experiencing drowsiness and to take lowest possible effective dose to minimize risk of dependence and tolerance. Patricia Shields reports that Patricia Shields would like to switch to Lorazepam. Will start Lorazepam 0.5 mg 1/2-1 tab po every 6 hours as needed for anxiety.  Continue Lunesta 3 mg po QHS for insomnia. Patricia Shields aware not to combine Lunesta and Lorazepam.  Patricia Shields to follow-up with this provider in 6 weeks or sooner if clinically indicated.  Patricia Shields advised to contact office with any questions, adverse effects, or acute worsening in signs and symptoms.   Patricia Shields was seen today for anxiety, other and depression.  Diagnoses and all orders for this visit:  Generalized anxiety disorder -     escitalopram (LEXAPRO) 5 MG tablet; TAKE 1.5 TABLETS WITH A 10 MG TABLET (TOTAL DOSE OF 17.5 MG) FOR 2 WEEKS, THEN INCREASE TO 2 TABLETS DAILY WITH A 10 MG TABLET AS TOLERATED  Panic disorder -     escitalopram (LEXAPRO) 5 MG tablet; TAKE 1.5 TABLETS WITH A 10 MG TABLET (TOTAL DOSE OF 17.5 MG) FOR 2 WEEKS, THEN INCREASE TO 2 TABLETS DAILY WITH A 10 MG TABLET AS TOLERATED -     LORazepam (ATIVAN) 0.5 MG tablet; Take 1/2-1 tab po every 6 hours as needed for anxiety  Insomnia, unspecified type -     Eszopiclone 3 MG TABS; Take 1 tablet (3  mg total) by mouth at bedtime as needed.     Please see After  Visit Summary for Patricia Shields specific instructions.  Future Appointments  Date Time Provider Department Center  11/09/2022 12:45 PM Corie Chiquito, PMHNP [facility] None    No orders of the defined types were placed in this encounter.   -------------------------------

## 2022-11-09 ENCOUNTER — Ambulatory Visit: Payer: BC Managed Care – PPO | Admitting: Psychiatry

## 2022-11-28 DIAGNOSIS — N764 Abscess of vulva: Secondary | ICD-10-CM | POA: Insufficient documentation

## 2022-12-07 ENCOUNTER — Ambulatory Visit: Payer: 59 | Admitting: Psychiatry

## 2022-12-07 ENCOUNTER — Encounter: Payer: Self-pay | Admitting: Psychiatry

## 2022-12-07 DIAGNOSIS — F410 Panic disorder [episodic paroxysmal anxiety] without agoraphobia: Secondary | ICD-10-CM | POA: Diagnosis not present

## 2022-12-07 DIAGNOSIS — F411 Generalized anxiety disorder: Secondary | ICD-10-CM | POA: Diagnosis not present

## 2022-12-07 DIAGNOSIS — G4700 Insomnia, unspecified: Secondary | ICD-10-CM | POA: Diagnosis not present

## 2022-12-07 MED ORDER — ESCITALOPRAM OXALATE 5 MG PO TABS: ORAL_TABLET | ORAL | 0 refills | Status: DC

## 2022-12-07 MED ORDER — ESCITALOPRAM OXALATE 10 MG PO TABS: ORAL_TABLET | ORAL | 1 refills | Status: DC

## 2022-12-07 NOTE — Progress Notes (Signed)
Patricia Shields [id number] August 07, 1965 57 y.o.  Subjective:   Patient ID:  Patricia Shields is a 57 y.o. (DOB 24-Aug-1965) female.  Chief Complaint:  Chief Complaint  Patient presents with   Follow-up    Anxiety, Depression, and insomnia    HPI Patricia Shields presents to the office today for follow-up of anxiety, depression, and insomnia.   She reports that she has been feeling flushed- "burning face" sensation and some sweating. She reports that this is occurring daily. Episodes of increased HR and shortness of breath happen "occasionally, but not like it was."   She reports, "I really see a difference in my stability" since increase in Lexapro. She has not needed prn medication as often. She reports, "I feel like I am managing (stress) better." She reports that she has felt slightly more frustrated than usual and thinks this may be related to increase in Lexapro. She reports that she is feeling "more tired," particularly at the end of the day. She reports that her motivation has been good. Denies depressed mood- "kind of encouraged." Denies uncontrolled crying. She reports that she is sleeping ok. She is trying to get to bed earlier. She wakes up around the same time daily. She reports that she grinds her teeth. Appetite has been good. She reports some slight weight gain. She reports food does not taste the same as it used to. Concentration has been ok. Enjoying things. Denies SI.   Husband had another surgery after femur surgery hardware failed.   Work has been going ok. She reports working full-time the last 2 weeks.     Past Psychiatric Medication Trials: Lunesta- Effective and well-tolerated Ambien- Effective for sleep. Felt medicated the next day Trazodone- Ineffective Lexapro- Has been helpful for anxiety. She started on 10 mg  Sertraline- Took 100 mg long-term. May have helped and then no longer seemed to be effective. Denies side effects Citalopram- Not helpful  for anxiety.  Cymbalta- Adverse reaction- "felt like I was coming out of my skin" Buspar-increased irritability Hydroxyzine- caused irritability Gabapentin- Prescribed but did not take Topamax Alprazolam-Helpful for panic. Denies side effects. Lorazepam    Review of Systems:  Review of Systems  Musculoskeletal:  Positive for arthralgias. Negative for gait problem.  Neurological:  Negative for tremors.  Psychiatric/Behavioral:         Please refer to HPI    Medications: I have reviewed the patient's current medications.  Current Outpatient Medications  Medication Sig Dispense Refill   atenolol (TENORMIN) 25 MG tablet Take 25 mg by mouth daily.     baclofen (LIORESAL) 10 MG tablet Take 1 tablet by mouth 3 (three) times daily as needed.     Eszopiclone 3 MG TABS Take 1 tablet (3 mg total) by mouth at bedtime as needed. 30 tablet 5   fluticasone (FLONASE) 50 MCG/ACT nasal spray as needed.     fluticasone-salmeterol (ADVAIR DISKUS) 250-50 MCG/ACT AEPB INHALE 1 PUFF INTO THE LUNGS TWICE A DAY FOR 30 DAYS     hydrochlorothiazide (HYDRODIURIL) 25 MG tablet Take 25 mg by mouth daily.     pantoprazole (PROTONIX) 40 MG tablet Take 40 mg by mouth daily.     simvastatin (ZOCOR) 20 MG tablet Take 1 tablet by mouth daily.     amLODipine (NORVASC) 5 MG tablet  (Patient not taking: Reported on 02/18/2022)     Cholecalciferol (VITAMIN D3) 1.25 MG (50000 UT) CAPS Take 1 capsule by mouth once a week. (Patient not taking: Reported on  09/27/2022)     clobetasol cream (TEMOVATE) 0.05 %      escitalopram (LEXAPRO) 10 MG tablet TAKE WITH 1.5 of a 5 MG TABLET TO EQUAL TOTAL DOSE OF 17.5 MG DAILY 90 tablet 1   escitalopram (LEXAPRO) 5 MG tablet TAKE 1.5 TABLETS WITH A 10 MG TABLET TO EQUAL TOTAL DOSE OF 17.5 MG 90 tablet 0   LORazepam (ATIVAN) 0.5 MG tablet Take 1/2-1 tab po every 6 hours as needed for anxiety 45 tablet 1   No current facility-administered medications for this visit.    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   Please see review of systems for further details on the patient's review from today.   Objective:   Physical Exam:  LMP 02/21/2011   Physical Exam Constitutional:      General: She is not in acute distress. Musculoskeletal:        General: No deformity.  Neurological:     Mental Status: She is alert and oriented to person, place, and time.     Coordination: Coordination normal.  Psychiatric:        Attention and Perception: Attention and perception normal. She does not perceive auditory or visual hallucinations.        Mood and Affect: Mood normal. Mood is not anxious or depressed. Affect is not labile, blunt, angry or inappropriate.        Speech: Speech normal.        Behavior: Behavior normal.        Thought Content: Thought content normal. Thought content is not paranoid or delusional. Thought content does not include homicidal or suicidal ideation. Thought content does not include homicidal or suicidal plan.        Cognition and Memory: Cognition and memory normal.        Judgment: Judgment normal.     Comments: Insight intact     Lab Review:     Component Value Date/Time   NA 139 03/18/2011 0515   K 4.0 03/18/2011 0515    CL 107 03/18/2011 0515   CO2 25 03/18/2011 0515   GLUCOSE 138 (H) 03/18/2011 0515   BUN 5 (L) 03/18/2011 0515   CREATININE 0.50 03/18/2011 0515   CALCIUM 8.9 03/18/2011 0515   GFRNONAA >90 03/18/2011 0515   GFRAA >90 03/18/2011 0515       Component Value Date/Time   WBC 8.9 12/20/2018 1549   WBC 11.9 (H) 03/18/2011 0515   RBC 3.97 12/20/2018 1549   RBC 3.30 (L) 03/18/2011 0515   HGB 13.7 12/20/2018 1549   HCT 39.4 12/20/2018 1549   PLT 254 12/20/2018 1549   MCV 99 (  H) 12/20/2018 1549   MCH 34.5 (H) 12/20/2018 1549   MCH 30.6 03/18/2011 0515   MCHC 34.8 12/20/2018 1549   MCHC 32.4 03/18/2011 0515   RDW 11.9 12/20/2018 1549    No results found for: "POCLITH", "LITHIUM"   No results found for: "PHENYTOIN", "PHENOBARB", "VALPROATE", "CBMZ"   .res Assessment: Plan:   33 minutes spent dedicated to the care of this patient on the date of this encounter to include pre-visit review of records, ordering of medication, post visit documentation, and face-to-face time with the patient discussing treatment options to address facial flushing and sweating. Pt reports that she has noticed improved mood and anxiety with Lexapro 20 mg daily, however she has also started experiencing flushing and sweating. Discussed option of adding glycopyrolate to possibly help with this side effect or reducing dose since side effect is often dose related. Pt reports that she would prefer to reduce Lexapro dose to 17.5 mg daily. Advised pt to contact office if flushing/sweating does not improve or she experiences worsening depression or anxiety.  Continue Lunesta 3 mg po at bedtime for insomnia.  Continue Lorazepam 0.5 mg 1/2-1 tab po q 6 hours prn anxiety. Pt reports that she does not need a refill at this time.  Pt to follow-up in 3 months or sooner if clinically indicated.  Patient advised to contact office with any questions, adverse effects, or acute worsening in signs and symptoms.    Patricia Shields was  seen today for follow-up.  Diagnoses and all orders for this visit:  Generalized anxiety disorder -     escitalopram (LEXAPRO) 5 MG tablet; TAKE 1.5 TABLETS WITH A 10 MG TABLET TO EQUAL TOTAL DOSE OF 17.5 MG -     escitalopram (LEXAPRO) 10 MG tablet; TAKE WITH 1.5 of a 5 MG TABLET TO EQUAL TOTAL DOSE OF 17.5 MG DAILY  Panic disorder -     escitalopram (LEXAPRO) 5 MG tablet; TAKE 1.5 TABLETS WITH A 10 MG TABLET TO EQUAL TOTAL DOSE OF 17.5 MG -     escitalopram (LEXAPRO) 10 MG tablet; TAKE WITH 1.5 of a 5 MG TABLET TO EQUAL TOTAL DOSE OF 17.5 MG DAILY  Insomnia, unspecified type     Please see After Visit Summary for patient specific instructions.  Future Appointments  Date Time Provider Department Center  03/08/2023 12:45 PM Corie Chiquito, PMHNP [facility] None    No orders of the defined types were placed in this encounter.   -------------------------------

## 2022-12-13 ENCOUNTER — Other Ambulatory Visit: Payer: Self-pay | Admitting: Internal Medicine

## 2022-12-13 DIAGNOSIS — E278 Other specified disorders of adrenal gland: Secondary | ICD-10-CM

## 2022-12-13 DIAGNOSIS — R1030 Lower abdominal pain, unspecified: Secondary | ICD-10-CM

## 2022-12-19 ENCOUNTER — Ambulatory Visit: Payer: Managed Care, Other (non HMO)

## 2022-12-21 ENCOUNTER — Other Ambulatory Visit: Payer: Self-pay | Admitting: Internal Medicine

## 2022-12-21 DIAGNOSIS — E278 Other specified disorders of adrenal gland: Secondary | ICD-10-CM

## 2022-12-23 ENCOUNTER — Encounter: Payer: Self-pay | Admitting: Internal Medicine

## 2022-12-26 ENCOUNTER — Other Ambulatory Visit: Payer: Managed Care, Other (non HMO)

## 2022-12-28 ENCOUNTER — Other Ambulatory Visit: Payer: Managed Care, Other (non HMO)

## 2023-01-06 ENCOUNTER — Other Ambulatory Visit: Payer: Managed Care, Other (non HMO)

## 2023-01-10 ENCOUNTER — Other Ambulatory Visit: Payer: Self-pay | Admitting: Psychiatry

## 2023-01-10 DIAGNOSIS — F410 Panic disorder [episodic paroxysmal anxiety] without agoraphobia: Secondary | ICD-10-CM

## 2023-01-11 ENCOUNTER — Ambulatory Visit
Admission: RE | Admit: 2023-01-11 | Discharge: 2023-01-11 | Disposition: A | Discharge status: Home / Self Care | Payer: Managed Care, Other (non HMO) | Source: Ambulatory Visit | Attending: Internal Medicine | Admitting: Internal Medicine

## 2023-01-11 DIAGNOSIS — E278 Other specified disorders of adrenal gland: Secondary | ICD-10-CM

## 2023-01-11 MED ORDER — IOPAMIDOL (ISOVUE-300) INJECTION 61%
125.0000 mL | Freq: Once | INTRAVENOUS | Status: AC | PRN
  Administered 2023-01-11: 125 mL via INTRAVENOUS

## 2023-01-19 ENCOUNTER — Telehealth: Payer: Self-pay | Admitting: Physical Medicine and Rehabilitation

## 2023-01-19 NOTE — Telephone Encounter (Signed)
IC scheduled sooner appt.

## 2023-01-19 NOTE — Telephone Encounter (Signed)
Patient called wanting to know if its an earlier appt. [phone]

## 2023-01-20 ENCOUNTER — Ambulatory Visit (INDEPENDENT_AMBULATORY_CARE_PROVIDER_SITE_OTHER): Payer: Managed Care, Other (non HMO) | Admitting: Physical Medicine and Rehabilitation

## 2023-01-20 ENCOUNTER — Encounter: Payer: Self-pay | Admitting: Physical Medicine and Rehabilitation

## 2023-01-20 DIAGNOSIS — M5416 Radiculopathy, lumbar region: Secondary | ICD-10-CM

## 2023-01-20 DIAGNOSIS — M7918 Myalgia, other site: Secondary | ICD-10-CM

## 2023-01-20 DIAGNOSIS — M5442 Lumbago with sciatica, left side: Secondary | ICD-10-CM

## 2023-01-20 DIAGNOSIS — G8929 Other chronic pain: Secondary | ICD-10-CM | POA: Diagnosis not present

## 2023-01-20 DIAGNOSIS — M797 Fibromyalgia: Secondary | ICD-10-CM

## 2023-01-20 NOTE — Progress Notes (Unsigned)
Functional Pain Scale - descriptive words and definitions  Distracting (5)    Aware of pain/able to complete some ADL's but limited by pain/sleep is affected and active distractions are only slightly useful. Moderate range order  Average Pain  varies   Lower back pain in the center with radiation in the legs. Concerned about mobility
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
## 2023-01-24 ENCOUNTER — Ambulatory Visit: Payer: Managed Care, Other (non HMO) | Admitting: Physical Medicine and Rehabilitation

## 2023-01-28 ENCOUNTER — Emergency Department (HOSPITAL_BASED_OUTPATIENT_CLINIC_OR_DEPARTMENT_OTHER)
Admission: EM | Admit: 2023-01-28 | Discharge: 2023-01-28 | Disposition: A | Discharge status: Home / Self Care | Payer: Managed Care, Other (non HMO) | Attending: Emergency Medicine | Admitting: Emergency Medicine

## 2023-01-28 DIAGNOSIS — X58XXXA Exposure to other specified factors, initial encounter: Secondary | ICD-10-CM | POA: Diagnosis not present

## 2023-01-28 DIAGNOSIS — S90822A Blister (nonthermal), left foot, initial encounter: Secondary | ICD-10-CM | POA: Diagnosis present

## 2023-01-28 DIAGNOSIS — L03119 Cellulitis of unspecified part of limb: Secondary | ICD-10-CM

## 2023-01-28 NOTE — ED Notes (Signed)
Reviewed AVS/discharge instruction with patient. Time allotted for and all questions answered. Patient is agreeable for d/c and escorted to ed exit by staff.  

## 2023-01-28 NOTE — ED Triage Notes (Signed)
She c/o a blister between her left toes IV and V, plus edema and erythema of her foot. ?cellulitis?

## 2023-01-28 NOTE — Discharge Instructions (Addendum)
Please take the entire course of antibiotics that you are already prescribed, rest, ice, compression, elevation of the affected extremity, you can use ibuprofen as well to help with pain and swelling.  If you are not noticing improvement in 2 to 3 days a recommend returning for further evaluation and possible change of antibiotics.  You can also apply some topical antibacterial ointment such as Polysporin, Neosporin to the affected area.

## 2023-01-28 NOTE — ED Provider Notes (Signed)
[place] EMERGENCY DEPARTMENT AT [facility] Provider Note   CSN: [id number] Arrival date & time: 01/28/23  1221     History  Chief Complaint  Patient presents with   Foot Pain    Patricia Shields is a 57 y.o. female with past medical history significant for anxiety, psoriatic arthritis who presents with concern for blister between left fourth and fifth toe, and concern for foot redness, possible developing cellulitis.  Patient was seen [facility] yesterday, and was diagnosed with possible early cellulitis, given a prescription for Keflex to take if swelling and redness worsen.  Patient was surprised at the degree of swelling worsening and wanted to make sure that everything was okay.  She denies any systemic fever, pus draining from the affected site but does report that the swelling is much worse than previous day.   Foot Pain       Home Medications Prior to Admission medications   Medication Sig Start Date End Date Taking? Authorizing Provider  atenolol (TENORMIN) 25 MG tablet Take 25 mg by mouth daily. 01/31/22   [provider]  baclofen (LIORESAL) 10 MG tablet Take 1 tablet by mouth 3 (three) times daily as needed. 07/06/21   [provider]  clobetasol cream (TEMOVATE) 0.05 %  03/18/19   [provider]  escitalopram (LEXAPRO) 10 MG tablet TAKE WITH 1.5 of a 5 MG TABLET TO EQUAL TOTAL DOSE OF 17.5 MG DAILY 12/07/22   Corie Chiquito, PMHNP  escitalopram (LEXAPRO) 5 MG tablet TAKE 1.5 TABLETS WITH A 10 MG TABLET TO EQUAL TOTAL DOSE OF 17.5 MG 12/07/22   Corie Chiquito, PMHNP  Eszopiclone 3 MG TABS Take 1 tablet (3 mg total) by mouth at bedtime as needed. 11/08/22   Corie Chiquito, PMHNP  fluticasone-salmeterol (ADVAIR DISKUS) 250-50 MCG/ACT AEPB INHALE 1 PUFF INTO THE LUNGS TWICE A DAY FOR 30 DAYS    [provider]  LORazepam (ATIVAN) 0.5 MG tablet TAKE 1/2-1 TABLET BY MOUTH EVERY 6 HOURS AS NEEDED FOR ANXIETY 01/11/23   Corie Chiquito, PMHNP  pantoprazole (PROTONIX) 40 MG tablet Take 40 mg by mouth daily. 11/11/18   [provider]  simvastatin (ZOCOR) 20 MG tablet Take 1 tablet by mouth daily. 05/23/21   [provider]      Allergies    Bactrim, Codeine, Demerol, and Meperidine    Review of Systems   Review of Systems  Skin:  Positive for rash.  All other systems reviewed and are negative.   Physical Exam Updated Vital Signs BP 128/62 (BP Location: Right Arm)   Pulse 83   Temp (!) 97.5 F (36.4 C)   Resp 17   LMP 02/21/2011   SpO2 100%  Physical Exam Vitals and nursing note reviewed.  Constitutional:      General: She is not in acute distress.    Appearance: Normal appearance.  HENT:     Head: Normocephalic and atraumatic.  Eyes:     General:        Right eye: No discharge.        Left eye: No discharge.  Cardiovascular:     Rate and Rhythm: Normal rate and regular rhythm.     Pulses: Normal pulses.  Pulmonary:     Effort: Pulmonary effort is normal. No respiratory distress.  Musculoskeletal:        General: No deformity.  Skin:    General: Skin is warm and dry.     Comments: Foot redness, soft  tissue swelling, with blister noted between 4th and 5th toes  Neurological:     Mental Status: She is alert and oriented to person, place, and time.  Psychiatric:        Mood and Affect: Mood normal.        Behavior: Behavior normal.     ED Results / Procedures / Treatments   Labs (all labs ordered are listed, but only abnormal results are displayed) Labs Reviewed - No data to display  EKG None  Radiology No results found.  Procedures .Marland KitchenIncision and Drainage  Date/Time: 01/28/2023 1:32 PM  Performed by: Olene Floss, PA-C Authorized by: Olene Floss, PA-C   Consent:    Consent obtained:  Verbal   Consent given by:  Patient   Risks, benefits, and alternatives were discussed: yes     Risks discussed:  Incomplete drainage, pain, infection,  bleeding and damage to other organs   Alternatives discussed:  No treatment Universal protocol:    Procedure explained and questions answered to patient or proxy's satisfaction: yes     Patient identity confirmed:  Verbally with patient Location:    Type:  Fluid collection   Size:  1x2cm   Location:  Lower extremity   Lower extremity location:  Toe   Toe location:  L little toe Pre-procedure details:    Skin preparation:  Povidone-iodine Anesthesia:    Anesthesia method:  None Procedure type:    Complexity:  Simple Procedure details:    Incision types:  Stab incision   Drainage:  Serous   Drainage amount:  Scant   Wound treatment:  Wound left open   Packing materials:  None Post-procedure details:    Procedure completion:  Tolerated     Medications Ordered in ED Medications - No data to display  ED Course/ Medical Decision Making/ A&P                                 Medical Decision Making  This patient is a 57 y.o. female who presents to the ED for concern of foot redness, swelling, concern for infection.   Differential diagnoses prior to evaluation: Cellulitis, abscess  Past Medical History / Social History / Additional history: Chart reviewed. Pertinent results include: Psoriatic arthritis, otherwise overall noncontributory  Physical Exam: Physical exam performed. The pertinent findings include: Foot redness, soft tissue swelling, with blister noted between 4th and 5th toes   Suspect localized cellulitis with serous blister possibly from toes rubbing together after soft tissue swelling.  No evidence of purulent drainage, abscess.  Serous collection was incised and returned scant amount of serous fluid.  Patient encouraged to begin taking the Keflex that she was already prescribed, and follow-up closely with PCP.  Medications / Treatment: Antibiotics, drainage as above, close follow-up   Disposition: After consideration of the diagnostic results and the  patients response to treatment, I feel that patient is stable for discharge with plan as above .   emergency department workup does not suggest an emergent condition requiring admission or immediate intervention beyond what has been performed at this time. The plan is: as above. The patient is safe for discharge and has been instructed to return immediately for worsening symptoms, change in symptoms or any other concerns.  Final Clinical Impression(s) / ED Diagnoses Final diagnoses:  Cellulitis of foot  Blister of left foot, initial encounter    Rx / DC Orders ED Discharge Orders  None         Olene Floss, [place] 01/28/23 1334    Terald Sleeper, MD 01/28/23 [id number]

## 2023-01-29 ENCOUNTER — Encounter: Payer: Self-pay | Admitting: Podiatry

## 2023-01-30 ENCOUNTER — Other Ambulatory Visit: Payer: Self-pay | Admitting: Podiatry

## 2023-01-30 ENCOUNTER — Encounter: Payer: Self-pay | Admitting: Podiatry

## 2023-01-30 ENCOUNTER — Ambulatory Visit: Payer: Managed Care, Other (non HMO) | Admitting: Podiatry

## 2023-01-30 ENCOUNTER — Telehealth: Payer: Self-pay | Admitting: Psychiatry

## 2023-01-30 DIAGNOSIS — M79672 Pain in left foot: Secondary | ICD-10-CM

## 2023-01-30 DIAGNOSIS — B353 Tinea pedis: Secondary | ICD-10-CM | POA: Diagnosis not present

## 2023-01-30 DIAGNOSIS — T148XXA Other injury of unspecified body region, initial encounter: Secondary | ICD-10-CM

## 2023-01-30 DIAGNOSIS — R600 Localized edema: Secondary | ICD-10-CM

## 2023-01-30 DIAGNOSIS — L03116 Cellulitis of left lower limb: Secondary | ICD-10-CM

## 2023-01-30 DIAGNOSIS — S90122A Contusion of left lesser toe(s) without damage to nail, initial encounter: Secondary | ICD-10-CM | POA: Diagnosis not present

## 2023-01-30 MED ORDER — FLUCONAZOLE 150 MG PO TABS: 150.0000 mg | ORAL_TABLET | Freq: Every day | ORAL | 0 refills | Status: DC

## 2023-01-30 NOTE — Progress Notes (Signed)
Chief Complaint  Patient presents with   Toe Pain     Pt presents with open blister wounds in between left foot 5th and 4th toe. Severe swelling on foot.   HPI: 57 y.o. female presents today with concern of swelling and redness and discomfort to the left foot.  States that she feels this may have started near the fifth toe, between the fourth and fifth toes.  She provided photos on her phone of the gradual worsening of redness and swelling to the fifth toe which began spreading proximally across the dorsum of the foot.  She was seen by her dermatologist when she saw a white spot between the fourth and fifth toes.  This eventually got worse and became a blister.  She went to the emergency room and this was drained and she was recently placed on Keflex 1-1/2 days ago.  She has been applying Neosporin between the toes.  Denies any injury.  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, left     Past Surgical History:  Procedure Laterality Date   ANKLE FRACTURE SURGERY     APPENDECTOMY     COLONOSCOPY WITH PROPOFOL N/A 01/04/2019   Procedure: COLONOSCOPY WITH PROPOFOL;  Surgeon: Toney Reil, MD;  Location: [facility] ENDOSCOPY;  Service: Gastroenterology;  Laterality: N/A;   CYSTECTOMY     SALPINGOOPHORECTOMY  03/17/2011   Procedure: SALPINGO OOPHERECTOMY;  Surgeon: Serita Kyle, MD;  Location: [facility] ORS;  Service: Gynecology;  Laterality: Left;   TONSILLECTOMY      Allergies  Allergen Reactions   Bactrim    Codeine Other (See Comments)    Reaction: causes accelerated heart rate    Demerol Other (See Comments)    Reaction: causes accelerated heart rate    Meperidine  Palpitations     Physical Exam: There were no vitals filed for this visit.  General: The patient is alert and oriented x3 in no acute distress.  Her husband is with her today.  Dermatology: There is an interdigital blister in the left fourth interspace.  There does appear to be some blood within the blister noted.  Some maceration is also noted in the left fourth interspace.  There is erythema most vivid near the left fourth interspace and then fading as it spreads proximally across the dorsum of the foot, which is not of a streaking nature, ending near the midfoot.  Vascular: Palpable pedal pulses bilaterally. Capillary refill within normal limits.  There is generalized edema on the entire left foot.  There is erythema and calor noted from the left fourth interspace spreading toward the midtarsal area dorsally  Neurological: Light touch sensation grossly intact bilateral feet.   Musculoskeletal Exam: There is pain with compression of the fourth interspace on the left foot where the area of concern is located.  There is no proximal metatarsal pain noted there is no pain in the distal portion of the toes.  Assessment/Plan of Care: 1. Cellulitis of left foot   2. Blister   3. Tinea pedis of left foot   4. Localized edema  Meds ordered this encounter  Medications   fluconazole (DIFLUCAN) 150 MG tablet    Sig: Take 1 tablet (150 mg total) by mouth daily.    Dispense:  4 tablet    Refill:  0   Discussed clinical findings with patient today.  The area was thoroughly cleansed with a sterile skin scrub for a couple minutes and then wiped dry.  The small hematoma in the left 4th interspace was punctured with a sterile #15 blade and the blister exudate (slight blood and serous drainage) was swabbed for aerobic and anaerobic culture.  A second swab was obtained of the skin of the interspace, avoiding the blistered area, for fungal culture.  The interspace was wiped with iodine solution  followed by a gauze wrapping between the toes as well as a gauze foot wrapping followed by an Ace wrap for compression.  Patient was instructed to stay off the foot is much as possible and elevate above the level of her heart for the next week.  She can continue with Epsom salt soaks once a day but do not have the water very hot as this will worsen the swelling.  Will have her continue with the Keflex since she just started this and we will send in for Diflucan tablets for her to take to treat possible fungal involvement.  We may need to change the antibiotic and/or antifungal medicine in the future based on the lab results.  She understands this.  Follow-up 1 week for recheck   Creg Gilmer Orland Mustard, DPM, FACFAS [facility]     [address] [place], [place] [address]                Office [phone]  Fax [phone]

## 2023-01-30 NOTE — Telephone Encounter (Signed)
Patient is prescribed to take q6h but is only prescribed 45 tablets. Told her if she needed it take it, if she didn't need it not take it, to just be sure she didn't take more than what was prescribed.

## 2023-01-30 NOTE — Telephone Encounter (Signed)
Patient lvm at 3:10 stating that she was recently diagnosed with a bacterial skin infection on foot. She has been advised to say off of foot for the next seven days. She asked if it was ok to take Lorazepam during the seven days for anxiety. Ph: [phone]

## 2023-01-31 ENCOUNTER — Ambulatory Visit: Payer: Self-pay | Admitting: Podiatry

## 2023-02-02 ENCOUNTER — Ambulatory Visit: Payer: Managed Care, Other (non HMO) | Admitting: Podiatry

## 2023-02-02 ENCOUNTER — Encounter: Payer: Self-pay | Admitting: Podiatry

## 2023-02-02 DIAGNOSIS — B353 Tinea pedis: Secondary | ICD-10-CM

## 2023-02-02 DIAGNOSIS — L03116 Cellulitis of left lower limb: Secondary | ICD-10-CM | POA: Diagnosis not present

## 2023-02-02 DIAGNOSIS — B999 Unspecified infectious disease: Secondary | ICD-10-CM

## 2023-02-02 DIAGNOSIS — R600 Localized edema: Secondary | ICD-10-CM | POA: Diagnosis not present

## 2023-02-02 MED ORDER — DOXYCYCLINE HYCLATE 100 MG PO TABS: 100.0000 mg | ORAL_TABLET | Freq: Two times a day (BID) | ORAL | 0 refills | Status: DC

## 2023-02-02 MED ORDER — TERBINAFINE HCL 250 MG PO TABS: 250.0000 mg | ORAL_TABLET | Freq: Every day | ORAL | 0 refills | Status: DC

## 2023-02-02 NOTE — Progress Notes (Signed)
Subjective:  Patient ID: Patricia Shields, female    DOB: 1965/08/31,  MRN: [id number]  Chief Complaint  Patient presents with   Toe Pain    Had wound cultures taken on 9/9 with Dr. Carlota Raspberry  Pt is concerned about the new swelling on her foot     57 y.o. female presents with the above complaint.  Patient presents with left fourth interspace superinfection.  Patient states that it has been present for quite some time.  Patient saw Dr. Carlota Raspberry earlier and was placed on antibiotics and antifungal and was told to come see me.  She denies any other acute complaints.  There are swelling on her foot she denies seeing anyone else prior to seeing me she is not a diabetic.  She has history of inflammatory arthritis.   Review of Systems: Negative except as noted in the HPI. Denies N/V/F/Ch.  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, left     Current Outpatient Medications:    doxycycline (VIBRA-TABS) 100 MG tablet, Take 1 tablet (100 mg total) by mouth 2 (two) times daily., Disp: 28 tablet, Rfl: 0   terbinafine (LAMISIL) 250 MG tablet, Take 1 tablet (250 mg total) by mouth daily., Disp: 30 tablet, Rfl: 0   atenolol (TENORMIN) 25 MG tablet, Take 25 mg by mouth daily., Disp: , Rfl:    baclofen (LIORESAL) 10 MG tablet, Take 1 tablet by mouth 3 (three) times daily as needed., Disp: , Rfl:    clobetasol cream (TEMOVATE) 0.05 %, , Disp: , Rfl:    escitalopram (LEXAPRO) 10 MG tablet, TAKE WITH 1.5 of a 5 MG TABLET TO EQUAL TOTAL DOSE OF 17.5 MG DAILY, Disp: 90 tablet, Rfl: 1   escitalopram (LEXAPRO) 5 MG tablet, TAKE 1.5 TABLETS WITH A 10 MG TABLET TO EQUAL TOTAL DOSE OF 17.5 MG, Disp: 90 tablet,  Rfl: 0   Eszopiclone 3 MG TABS, Take 1 tablet (3 mg total) by mouth at bedtime as needed., Disp: 30 tablet, Rfl: 5   fluconazole (DIFLUCAN) 150 MG tablet, Take 1 tablet (150 mg total) by mouth daily., Disp: 4 tablet, Rfl: 0   fluticasone-salmeterol (ADVAIR DISKUS) 250-50 MCG/ACT AEPB, INHALE 1 PUFF INTO THE LUNGS TWICE A DAY FOR 30 DAYS, Disp: , Rfl:    LORazepam (ATIVAN) 0.5 MG tablet, TAKE 1/2-1 TABLET BY MOUTH EVERY 6 HOURS AS NEEDED FOR ANXIETY, Disp: 45 tablet, Rfl: 1   pantoprazole (PROTONIX) 40 MG tablet, Take 40 mg by mouth daily., Disp: , Rfl:    simvastatin (ZOCOR) 20 MG tablet, Take 1 tablet by mouth daily., Disp: , Rfl:   Social History   Tobacco Use  Smoking Status Every Day   Current packs/day: 0.50   Average packs/day: 0.5 packs/day for 20.0 years (10.0 ttl pk-yrs)   Types: Cigarettes  Smokeless Tobacco Never  Tobacco Comments   Attempting to stop smoking    Allergies  Allergen Reactions   Bactrim    Codeine Other (See Comments)    Reaction: causes accelerated heart rate    Demerol Other (See Comments)    Reaction: causes  accelerated heart rate    Meperidine Palpitations   Objective:  There were no vitals filed for this visit. There is no height or weight on file to calculate BMI. Constitutional Well developed. Well nourished.  Vascular Dorsalis pedis pulses palpable bilaterally. Posterior tibial pulses palpable bilaterally. Capillary refill normal to all digits.  No cyanosis or clubbing noted. Pedal hair growth normal.  Neurologic Normal speech. Oriented to person, place, and time. Epicritic sensation to light touch grossly present bilaterally.  Dermatologic Left fourth interspace macerated skin noted with subjective complaint of itching and blistering likely consistent with superinfection.  Some swelling noted.  Orthopedic: Normal joint ROM without pain or crepitus bilaterally. No visible deformities. No bony tenderness.   Radiographs:  None Assessment:   1. Tinea pedis of left foot   2. Localized edema   3. Cellulitis of left foot   4. Superinfection    Plan:  Patient was evaluated and treated and all questions answered.  Left fourth interspace superinfection -All questions and concerns were discussed with the patient extensive detail -Given the amount of superinfection that is present patient benefit from 14 days of doxycycline and 30 days of Lamisil to help clear it out as well as Betadine wet-to-dry dressing once a day.  I discussed this with patient and she states understanding will do so right away.  She denies any liver history  No follow-ups on file.

## 2023-02-03 ENCOUNTER — Encounter: Payer: Self-pay | Admitting: Podiatry

## 2023-02-03 LAB — WOUND CULTURE
MICRO NUMBER:: 15440075
MICRO NUMBER:: 15440109
RESULT:: NO GROWTH
RESULT:: NO GROWTH
SPECIMEN QUALITY:: ADEQUATE
SPECIMEN QUALITY:: ADEQUATE

## 2023-02-03 LAB — DUPLICATE REPORT

## 2023-02-03 LAB — HOUSE ACCOUNT TRACKING

## 2023-02-05 ENCOUNTER — Encounter: Payer: Self-pay | Admitting: Podiatry

## 2023-02-07 ENCOUNTER — Ambulatory Visit: Payer: Managed Care, Other (non HMO) | Admitting: Podiatry

## 2023-02-07 ENCOUNTER — Other Ambulatory Visit: Payer: Self-pay | Admitting: Podiatry

## 2023-02-07 MED ORDER — ITRACONAZOLE 100 MG PO CAPS: 100.0000 mg | ORAL_CAPSULE | Freq: Every day | ORAL | 0 refills | Status: DC

## 2023-02-12 ENCOUNTER — Other Ambulatory Visit: Payer: Managed Care, Other (non HMO)

## 2023-02-16 ENCOUNTER — Telehealth: Payer: Self-pay | Admitting: Physical Medicine and Rehabilitation

## 2023-02-16 NOTE — Telephone Encounter (Signed)
Spoke with patient and she wanted to reach out because [facility] cancelled her MRI twice due to being denied. Can you look into it please?

## 2023-02-16 NOTE — Telephone Encounter (Signed)
Patient called asked for a call back concerning her appointment for an MRI of her lumbar spine. The number to contact patient is [phone]

## 2023-02-17 NOTE — Telephone Encounter (Signed)
P2P scheduled for Friday oct 4 at 1pm with Dr. Chevis Pretty for [facility].

## 2023-02-19 ENCOUNTER — Other Ambulatory Visit: Payer: Managed Care, Other (non HMO)

## 2023-02-20 ENCOUNTER — Telehealth: Payer: Self-pay | Admitting: Physical Medicine and Rehabilitation

## 2023-02-24 ENCOUNTER — Telehealth: Payer: Self-pay | Admitting: Psychiatry

## 2023-02-24 DIAGNOSIS — F411 Generalized anxiety disorder: Secondary | ICD-10-CM

## 2023-02-24 DIAGNOSIS — F410 Panic disorder [episodic paroxysmal anxiety] without agoraphobia: Secondary | ICD-10-CM

## 2023-02-24 NOTE — Telephone Encounter (Signed)
Patient called in stating that she needs a prescription for Lexapro 10mg . She believes she is taking 17.5mg  and is currently prescribed 10mg  and 5mg  but needs the 10 because they are easier to break. PH: [phone] Appt 10/16 Pharmacy [facility] [address] [place], [place]

## 2023-02-24 NOTE — Telephone Encounter (Signed)
Patricia Shields could not do a p2p today so wanted to reschedule for sometime Wednesday- I called and spoke with Ruben Gottron with [facility] and went over the notes for reconsideration. This did go into reconsideration and should hear something by end of day today (Friday) no later than Monday.

## 2023-02-27 MED ORDER — ESCITALOPRAM OXALATE 10 MG PO TABS: ORAL_TABLET | ORAL | 1 refills | Status: DC

## 2023-02-27 NOTE — Telephone Encounter (Signed)
Sent!

## 2023-02-27 NOTE — Telephone Encounter (Signed)
Pt has been approved and I notified and left voice message letting pt know

## 2023-03-01 ENCOUNTER — Encounter: Payer: Self-pay | Admitting: Podiatry

## 2023-03-02 ENCOUNTER — Ambulatory Visit: Payer: Managed Care, Other (non HMO) | Admitting: Podiatry

## 2023-03-04 ENCOUNTER — Ambulatory Visit
Admission: RE | Admit: 2023-03-04 | Discharge: 2023-03-04 | Disposition: A | Discharge status: Home / Self Care | Payer: Managed Care, Other (non HMO) | Source: Ambulatory Visit | Attending: Physical Medicine and Rehabilitation | Admitting: Physical Medicine and Rehabilitation

## 2023-03-04 DIAGNOSIS — M7918 Myalgia, other site: Secondary | ICD-10-CM

## 2023-03-04 DIAGNOSIS — G8929 Other chronic pain: Secondary | ICD-10-CM

## 2023-03-04 DIAGNOSIS — M5416 Radiculopathy, lumbar region: Secondary | ICD-10-CM

## 2023-03-04 DIAGNOSIS — M797 Fibromyalgia: Secondary | ICD-10-CM

## 2023-03-06 ENCOUNTER — Other Ambulatory Visit: Payer: Self-pay | Admitting: Physical Medicine and Rehabilitation

## 2023-03-06 ENCOUNTER — Telehealth: Payer: Self-pay | Admitting: Physical Medicine and Rehabilitation

## 2023-03-06 DIAGNOSIS — M5416 Radiculopathy, lumbar region: Secondary | ICD-10-CM

## 2023-03-06 DIAGNOSIS — M797 Fibromyalgia: Secondary | ICD-10-CM

## 2023-03-06 DIAGNOSIS — M7918 Myalgia, other site: Secondary | ICD-10-CM

## 2023-03-06 DIAGNOSIS — G8929 Other chronic pain: Secondary | ICD-10-CM

## 2023-03-06 MED ORDER — BACLOFEN 10 MG PO TABS: 10.0000 mg | ORAL_TABLET | Freq: Three times a day (TID) | ORAL | 0 refills | Status: DC

## 2023-03-06 NOTE — Telephone Encounter (Signed)
Pt called requesting a call back from PA Brainards. Pt state she is now having sharp left pain in lower back. Pt states it's affecting her walking some. Pt states is it anything she can do to get MRI results sooner or medical advice. Please call pt at [phone].

## 2023-03-08 ENCOUNTER — Ambulatory Visit: Payer: 59 | Admitting: Psychiatry

## 2023-03-09 ENCOUNTER — Telehealth: Payer: Self-pay | Admitting: Physical Medicine and Rehabilitation

## 2023-03-09 NOTE — Telephone Encounter (Signed)
FYI.Marland KitchenMarland KitchenMarland KitchenPt called in stating she spoke with [facility] on Monday and she has been taking the Baclofen as prescribed and things have gotten worse and she reached out to [facility] about the scan and they have marked priority to be read and now at the end of the week she has been at work and has been having issues walking because of the lower back, please call if you have questions

## 2023-03-10 ENCOUNTER — Inpatient Hospital Stay: Payer: Managed Care, Other (non HMO) | Attending: Internal Medicine | Admitting: Internal Medicine

## 2023-03-10 ENCOUNTER — Telehealth: Payer: Self-pay

## 2023-03-10 ENCOUNTER — Encounter: Payer: Self-pay | Admitting: Internal Medicine

## 2023-03-10 ENCOUNTER — Inpatient Hospital Stay: Payer: Managed Care, Other (non HMO)

## 2023-03-10 VITALS — BP 128/71 | HR 101 | Temp 99.1°F | Ht 66.0 in | Wt 214.4 lb

## 2023-03-10 DIAGNOSIS — D7589 Other specified diseases of blood and blood-forming organs: Secondary | ICD-10-CM

## 2023-03-10 DIAGNOSIS — Z79899 Other long term (current) drug therapy: Secondary | ICD-10-CM | POA: Insufficient documentation

## 2023-03-10 DIAGNOSIS — D649 Anemia, unspecified: Secondary | ICD-10-CM | POA: Insufficient documentation

## 2023-03-10 DIAGNOSIS — F17210 Nicotine dependence, cigarettes, uncomplicated: Secondary | ICD-10-CM | POA: Diagnosis not present

## 2023-03-10 LAB — COMPREHENSIVE METABOLIC PANEL
ALT: 17 U/L (ref 0–44)
AST: 20 U/L (ref 15–41)
Albumin: 4.3 g/dL (ref 3.5–5.0)
Alkaline Phosphatase: 59 U/L (ref 38–126)
Anion gap: 8 (ref 5–15)
BUN: 17 mg/dL (ref 6–20)
CO2: 24 mmol/L (ref 22–32)
Calcium: 8.8 mg/dL — ABNORMAL LOW (ref 8.9–10.3)
Chloride: 108 mmol/L (ref 98–111)
Creatinine, Ser: 0.56 mg/dL (ref 0.44–1.00)
GFR, Estimated: >60 mL/min (ref >60)
Glucose, Bld: 116 mg/dL — ABNORMAL HIGH (ref 70–99)
Potassium: 3.6 mmol/L (ref 3.5–5.1)
Sodium: 140 mmol/L (ref 135–145)
Total Bilirubin: 0.5 mg/dL (ref 0.3–1.2)
Total Protein: 6.8 g/dL (ref 6.5–8.1)

## 2023-03-10 LAB — CBC WITH DIFFERENTIAL/PLATELET
Abs Immature Granulocytes: 0.02 10*3/uL (ref 0.00–0.07)
Basophils Absolute: 0.1 10*3/uL (ref 0.0–0.1)
Basophils Relative: 1 %
Eosinophils Absolute: 0.3 10*3/uL (ref 0.0–0.5)
Eosinophils Relative: 4 %
HCT: 38.4 % (ref 36.0–46.0)
Hemoglobin: 13.3 g/dL (ref 12.0–15.0)
Immature Granulocytes: 0 %
Lymphocytes Relative: 32 %
Lymphs Abs: 2.9 10*3/uL (ref 0.7–4.0)
MCH: 34.1 pg — ABNORMAL HIGH (ref 26.0–34.0)
MCHC: 34.6 g/dL (ref 30.0–36.0)
MCV: 98.5 fL (ref 80.0–100.0)
Monocytes Absolute: 0.5 10*3/uL (ref 0.1–1.0)
Monocytes Relative: 5 %
Neutro Abs: 5.1 10*3/uL (ref 1.7–7.7)
Neutrophils Relative %: 58 %
Platelets: 262 10*3/uL (ref 150–400)
RBC: 3.9 MIL/uL (ref 3.87–5.11)
RDW: 11.5 % (ref 11.5–15.5)
WBC: 8.8 10*3/uL (ref 4.0–10.5)
nRBC: 0 % (ref 0.0–0.2)

## 2023-03-10 LAB — VITAMIN B12: Vitamin B-12: 248 pg/mL (ref 180–914)

## 2023-03-10 LAB — RETICULOCYTES
Immature Retic Fract: 6.7 % (ref 2.3–15.9)
RBC.: 3.86 MIL/uL — ABNORMAL LOW (ref 3.87–5.11)
Retic Count, Absolute: 91.5 10*3/uL (ref 19.0–186.0)
Retic Ct Pct: 2.4 % (ref 0.4–3.1)

## 2023-03-10 LAB — FOLATE: Folate: 32 ng/mL (ref >5.9)

## 2023-03-10 LAB — LACTATE DEHYDROGENASE: LDH: 161 U/L (ref 98–192)

## 2023-03-10 NOTE — Assessment & Plan Note (Deleted)
#   Anemia- Hb-symptomatic.  Likely due to iron deficiency - from etiology GI blood loss/menorrhagia/malabsorption.  Poor tolerance/lack of improvement on oral iron.  Discussed regarding IV iron infusion/Venofer. Discussed the potential acute infusion reactions with IV iron; which are quite rare.  Patient understands the risk; will proceed with infusions.   # Recommend CBC CMP LDH peripheral smear; haptoglobin; iron studies ferritin B12 folic acid; Urine analysis.  Urine pregnancy test.   #Etiology of iron deficiency: Unclear; I had a long discussion with the patient regarding multiple etiologies of anemia including iron deficiency-which is mainly caused by blood loss/malabsorption.  Recommend GI evaluation-EGD colonoscopy; capsule study; CT scan abdomen pelvis.   I had a long discussion with the patient regarding various forms of dietary iron as it is present universally in meat.  Also discussed regarding plant sources-including but not limited to leafy vegetables [spinach], broccoli. Recommend gentle iron 1 pill a day which should not upset stomach or cause constipation.  Patient is intolerant to oral iron/or iron levels do not improve on oral supplementation-recommend IV iron infusions.   Would recommend blood transfusion if hemoglobin 7-8 /symptomatic.  Discussed the reasoning for PRBC transfusion; and also also the potential risk of transfusion including but not limited to risk of infusion reactions; transmission of infections amongst others.  However the risk is extremely small; and the benefits of the infusion overweighs the risk.  Patient agreement to proceed with transfusion if needed.  Check ABO/Rh; hold tube.    Thank you Dr. for allowing me to participate in the care of your pleasant patient. Please do not hesitate to contact me with questions or concerns in the interim.   # DISPOSITION: # labs today # Possible 1 unit PRBC tomorrow OR venofer # weekly venofer x3  # follow up 5 weeks-  MD; labs- cbc/bmp; possible venofer- Dr.B

## 2023-03-10 NOTE — Telephone Encounter (Signed)
Spoke with patient and scheduled OV for 03/13/23.

## 2023-03-10 NOTE — Telephone Encounter (Signed)
-----   Message from [place], [place] E sent at 03/09/2023  3:32 PM EDT ----- She needs to be scheduled for lumbar MRI review. ----- Message ----- From: Interface, Rad Results In Sent: 03/09/2023   3:07 PM EDT To: Juanda Chance, NP

## 2023-03-10 NOTE — Assessment & Plan Note (Addendum)
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  With Rheumatologist [off [facility] since 2022]-intermittent prednisone   # #Macrocytosis MCV 100; without any significant anemia.  Discussed multiple etiologies of macrocytosis-including nutritional deficiency like B12 folic acid; medications and and liver disorders,  bone marrow disorders like myelodysplastic syndromes etc. however patient currently not on methotrexate.  No clinical concerns of MDS; or hemolysis. Recommend holding off any invasive procedures like bone marrow biopsy at this time.   # Will repeat labs today.   Thank you Dr. Dario Guardian for allowing me to participate in the care of your pleasant patient. Please do not hesitate to contact me with questions or concerns in the interim.  # DISPOSITION: # labs- cbc/cmp; LDH; b12, folic acid # Follow up TBD- Dr.B

## 2023-03-10 NOTE — Progress Notes (Signed)
Fatigue/weakness: yes Dyspena: no Light headedness: yes Blood in stool: no  Pt temp 99.1, "feels like hot flash, not right".  Pain 9/10, back, herniated disk.

## 2023-03-10 NOTE — Progress Notes (Signed)
[facility] CONSULT NOTE  Patient Care Team: Sherrie Mustache, MD as PCP - General (Internal Medicine) Earna Coder, MD as Consulting Physician (Oncology)  CHIEF COMPLAINTS/PURPOSE OF CONSULTATION: ANEMIA   HEMATOLOGY HISTORY  # ANEMIA[Hb; MCV-platelets- WBC; Iron sat; ferritin;  GFR- CT/US- ;   HISTORY OF PRESENTING ILLNESS:  Patricia Shields 57 y.o.  female pleasant patient is  been referred to [place] for further evaluation of macrocytosis.   Patient has a history of psoriatic arthritis for which she follows up with rheumatology.  Patient not currently on any immunosuppressive therapy or methotrexate.  Patient has ongoing fatigue.  Otherwise denies any blood in stools or black-colored stools.  Patient has chronic back pain .   Blood in stools: EGD/colonoscopy [2022]- Change of bowel movement/constipation:none Prior blood transfusion:none Kidney/Liver disease: none Alcohol: none Bariatric surgery:none  Vaginal bleeding: none Prior evaluation with hematology:none Prior bone marrow biopsy: none Oral iron: none Prior IV iron infusions: none     Review of Systems  Constitutional:  Positive for malaise/fatigue. Negative for chills, diaphoresis, fever and weight loss.  HENT:  Negative for nosebleeds and sore throat.   Eyes:  Negative for double vision.  Respiratory:  Negative for cough, hemoptysis, sputum production, shortness of breath and wheezing.   Cardiovascular:  Negative for chest pain, palpitations, orthopnea and leg swelling.  Gastrointestinal:  Negative for abdominal pain, blood in stool, constipation, diarrhea, heartburn, melena, nausea and vomiting.  Genitourinary:  Negative for dysuria, frequency and urgency.  Musculoskeletal:  Positive for back pain. Negative for joint pain.  Skin: Negative.  Negative for itching and rash.  Neurological:  Negative for dizziness, tingling, focal weakness, weakness and headaches.  Endo/Heme/Allergies:  Does not  bruise/bleed easily.  Psychiatric/Behavioral:  Negative for depression. The patient is not nervous/anxious and does not have insomnia.      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, left     SURGICAL HISTORY: Past Surgical History:  Procedure Laterality Date   ANKLE FRACTURE SURGERY     APPENDECTOMY     COLONOSCOPY WITH PROPOFOL N/A 01/04/2019   Procedure: COLONOSCOPY WITH PROPOFOL;  Surgeon: Toney Reil, MD;  Location: [facility] ENDOSCOPY;  Service: Gastroenterology;  Laterality: N/A;   CYSTECTOMY     LAPAROSCOPIC HYSTERECTOMY  2012   SALPINGOOPHORECTOMY  03/17/2011   Procedure: SALPINGO OOPHERECTOMY;  Surgeon: Serita Kyle, MD;  Location: [facility];  Service: Gynecology;  Laterality: Left;   TONSILLECTOMY      SOCIAL HISTORY: Social History   Socioeconomic History   Marital status: Married    Spouse name: Not on file   Number of children: Not on file   Years of education: Not on file   Highest education level: Not on file  Occupational History   Not on file  Tobacco Use   Smoking status: Every Day    Current packs/day: 0.50    Average packs/day: 0.5 packs/day for 20.0 years (10.0 ttl pk-yrs)    Types: Cigarettes   Smokeless tobacco: Never   Tobacco comments:    Attempting to stop  smoking  Vaping Use   Vaping status: Never Used  Substance and Sexual Activity   Alcohol use: Yes    Comment: occ   Drug use: No   Sexual activity: Yes  Other Topics Concern   Not on file  Social History Narrative   Not on file   Social Determinants of Health   Financial Resource Strain: Not on file  Food Insecurity: No Food Insecurity  (03/10/2023)   Hunger Vital Sign    Worried About Running Out of Food in the Last Year: Never true    Ran Out of Food in the Last Year: Never true  Transportation Needs: No Transportation Needs (03/10/2023)   PRAPARE - Administrator, Civil Service (Medical): No    Lack of Transportation (Non-Medical): No  Physical Activity: Not on file  Stress: Not on file  Social Connections: Unknown (03/07/2022)   Received from [facility]   Social Network    Social Network: Not on file  Intimate Partner Violence: Not At Risk (03/10/2023)   Humiliation, Afraid, Rape, and Kick questionnaire    Fear of Current or Ex-Partner: No    Emotionally Abused: No    Physically Abused: No    Sexually Abused: No    FAMILY HISTORY: Family History  Problem Relation Age of Onset   Dementia Mother    Raynaud syndrome Mother    Bladder Cancer Father    Diabetes Father    Multiple sclerosis Sister     ALLERGIES:  is allergic to bactrim, codeine, demerol, and meperidine.  MEDICATIONS:  Current Outpatient Medications  Medication Sig Dispense Refill   atenolol (TENORMIN) 25 MG tablet Take 25 mg by mouth daily.     baclofen (LIORESAL) 10 MG tablet Take 1 tablet (10 mg total) by mouth 3 (three) times daily. 30 each 0   clobetasol cream (TEMOVATE) 0.05 %      escitalopram (LEXAPRO) 10 MG tablet TAKE WITH 1.5 of a 5 MG TABLET TO EQUAL TOTAL DOSE OF 17.5 MG DAILY 90 tablet 1   escitalopram (LEXAPRO) 5 MG tablet TAKE 1.5 TABLETS WITH A 10 MG TABLET TO EQUAL TOTAL DOSE OF 17.5 MG 90 tablet 0   Eszopiclone 3 MG TABS Take 1 tablet (3 mg total) by mouth at bedtime as needed. 30 tablet 5   fluconazole (DIFLUCAN) 150 MG tablet Take 1 tablet (150 mg total) by mouth daily. 4 tablet 0   fluticasone-salmeterol (ADVAIR DISKUS) 250-50 MCG/ACT AEPB INHALE 1 PUFF INTO THE LUNGS TWICE A DAY FOR 30 DAYS     LORazepam (ATIVAN) 0.5 MG tablet TAKE 1/2-1 TABLET BY MOUTH EVERY 6 HOURS AS NEEDED FOR ANXIETY  45 tablet 1   pantoprazole (PROTONIX) 40 MG tablet Take 40 mg by mouth daily.     simvastatin (ZOCOR) 20 MG tablet Take 1 tablet by mouth daily.     No current facility-administered medications for this visit.     PHYSICAL EXAMINATION:   Vitals:   03/10/23 1357  BP: 128/71  Pulse: (!) 101  Temp: 99.1 F (37.3 C)  SpO2: 100%   Filed Weights   03/10/23 1357  Weight: 214 lb 6.4 oz (97.3 kg)    Physical Exam Vitals and nursing note reviewed.  HENT:     Head: Normocephalic and atraumatic.     Mouth/Throat:     Pharynx: Oropharynx is clear.  Eyes:     Extraocular Movements: Extraocular movements intact.     Pupils: Pupils are equal, round,  and reactive to light.  Cardiovascular:     Rate and Rhythm: Normal rate and regular rhythm.  Pulmonary:     Comments: Decreased breath sounds bilaterally.  Abdominal:     Palpations: Abdomen is soft.  Musculoskeletal:        General: Normal range of motion.     Cervical back: Normal range of motion.  Skin:    General: Skin is warm.  Neurological:     General: No focal deficit present.     Mental Status: She is alert and oriented to person, place, and time.  Psychiatric:        Behavior: Behavior normal.        Judgment: Judgment normal.      LABORATORY DATA:  I have reviewed the data as listed Lab Results  Component Value Date   WBC 8.8 03/10/2023   HGB 13.3 03/10/2023   HCT 38.4 03/10/2023   MCV 98.5 03/10/2023   PLT 262 03/10/2023   Recent Labs    03/10/23 1450  NA 140  K 3.6  CL 108  CO2 24  GLUCOSE 116*  BUN 17  CREATININE 0.56  CALCIUM 8.8*  GFRNONAA >60  PROT 6.8  ALBUMIN 4.3  AST 20  ALT 17  ALKPHOS 59  BILITOT 0.5     MR LUMBAR SPINE WO CONTRAST  Result Date: 03/09/2023 CLINICAL DATA:  Persistent low back pain with left-sided sciatica. Difficulty walking for 3 months. No previous relevant surgery. EXAM: MRI LUMBAR SPINE WITHOUT CONTRAST TECHNIQUE: Multiplanar, multisequence MR imaging of the  lumbar spine was performed. No intravenous contrast was administered. COMPARISON:  Lumbar MRI 12/06/2020.  Abdominopelvic CT 01/11/2023 FINDINGS: Segmentation: Conventional anatomy assumed, with the last open disc space designated L5-S1.Concordant with prior imaging. Alignment: 5 mm of degenerative anterolisthesis at L4-5 may be minimally progressive. Otherwise normal. Vertebrae: No worrisome osseous lesion, acute fracture or pars defect. Stable endplate degenerative changes at L2-3 and reactive marrow edema within the posterior elements at L4-5 bilaterally. The visualized sacroiliac joints appear unremarkable. Conus medullaris: Extends to the L2 level and appears normal. Paraspinal and other soft tissues: No significant paraspinal findings. Stable mildly complex cyst (Bosniak 2) projecting posteriorly from the mid left kidney; no specific follow-up imaging recommended. Disc levels: Sagittal images demonstrate no significant disc space findings within the visualized lower thoracic spine. There is stable mild disc bulging at T10-11 and T11-12 without cord deformity or significant foraminal narrowing. L1-2: Chronic mild loss of disc height with disc bulging and chronic Schmorl's node formation. Mild facet hypertrophy. No spinal stenosis or foraminal narrowing. L2-3: Stable chronic moderate loss of disc height with annular disc bulging and endplate osteophytes. No residual disc protrusion identified in the right subarticular zone. There is mild facet and ligamentous hypertrophy. There is mild spinal stenosis with mild lateral recess and foraminal narrowing bilaterally. No definite nerve root encroachment. L3-4: Preserved disc height and hydration. Mild bilateral facet hypertrophy. No spinal stenosis or foraminal narrowing. L4-5: Again demonstrated is moderate to severe bilateral facet hypertrophy accounting for the grade 1 anterolisthesis. There is resulting disc space narrowing with annular disc bulging and  uncovering. Resulting mildly increased spinal stenosis, mild-to-moderate. Mildly progressive but still mild lateral recess and foraminal narrowing bilaterally. L5-S1: Preserved disc height and hydration. Stable small left foraminal disc protrusion resulting in mild left foraminal narrowing. Bilateral facet hypertrophy, worse on the left. The spinal canal is patent. IMPRESSION: 1. Compared with previous MRI from 12/06/2020, no acute findings or clear explanation for the  patient's symptoms. No high-grade left foraminal narrowing. 2. Mild multifactorial spinal stenosis at L2-3 with mild lateral recess and foraminal narrowing bilaterally. 3. Mildly increased mild to moderate multifactorial spinal stenosis at L4-5 with mild lateral recess and foraminal narrowing bilaterally. 4. Stable mild left foraminal narrowing at L5-S1. Electronically Signed   By: Carey Bullocks M.D.   On: 03/09/2023 15:04    ASSESSMENT & PLAN:   Macrocytosis # Macrocytosis- MCV 104-without anemia.  With Rheumatologist [off [facility] since 2022]-intermittent prednisone   # #Macrocytosis MCV 100; without any significant anemia.  Discussed multiple etiologies of macrocytosis-including nutritional deficiency like B12 folic acid; medications and and liver disorders,  bone marrow disorders like myelodysplastic syndromes etc. however patient currently not on methotrexate.  No clinical concerns of MDS; or hemolysis. Recommend holding off any invasive procedures like bone marrow biopsy at this time.   # Will repeat labs today.   Thank you Dr. Dario Guardian for allowing me to participate in the care of your pleasant patient. Please do not hesitate to contact me with questions or concerns in the interim.  # DISPOSITION: # labs- cbc/cmp; LDH; b12, folic acid # Follow up TBD- Dr.B  All questions were answered. The patient knows to call the clinic with any problems, questions or concerns.    Earna Coder, MD 03/10/2023 3:33 PM

## 2023-03-11 ENCOUNTER — Other Ambulatory Visit: Payer: Managed Care, Other (non HMO)

## 2023-03-13 ENCOUNTER — Ambulatory Visit: Payer: Managed Care, Other (non HMO) | Admitting: Physical Medicine and Rehabilitation

## 2023-03-13 ENCOUNTER — Encounter: Payer: Self-pay | Admitting: Physical Medicine and Rehabilitation

## 2023-03-13 DIAGNOSIS — M5416 Radiculopathy, lumbar region: Secondary | ICD-10-CM | POA: Diagnosis not present

## 2023-03-13 DIAGNOSIS — M7918 Myalgia, other site: Secondary | ICD-10-CM

## 2023-03-13 DIAGNOSIS — M797 Fibromyalgia: Secondary | ICD-10-CM | POA: Diagnosis not present

## 2023-03-13 DIAGNOSIS — G8929 Other chronic pain: Secondary | ICD-10-CM

## 2023-03-13 DIAGNOSIS — M5442 Lumbago with sciatica, left side: Secondary | ICD-10-CM

## 2023-03-13 NOTE — Progress Notes (Signed)
Functional Pain Scale - descriptive words and definitions  Distressing (6)    Pain is present/unable to complete most ADLs limited by pain/sleep is difficult and active distraction is only marginal. Moderate range order  Average Pain 8  Lower back pain. MRI review
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
## 2023-03-14 NOTE — Telephone Encounter (Signed)
Dr. Kathi Ludwig can view notes in Epic and Dr. Dario Guardian has been copied being the PCP.

## 2023-03-25 ENCOUNTER — Other Ambulatory Visit: Payer: Managed Care, Other (non HMO)

## 2023-04-05 ENCOUNTER — Encounter: Payer: Self-pay | Admitting: Psychiatry

## 2023-04-05 ENCOUNTER — Other Ambulatory Visit: Payer: Self-pay | Admitting: Psychiatry

## 2023-04-05 DIAGNOSIS — G4700 Insomnia, unspecified: Secondary | ICD-10-CM

## 2023-04-05 NOTE — Telephone Encounter (Signed)
Due 11/17

## 2023-04-12 ENCOUNTER — Ambulatory Visit: Payer: 59 | Admitting: Psychiatry

## 2023-04-25 ENCOUNTER — Ambulatory Visit (INDEPENDENT_AMBULATORY_CARE_PROVIDER_SITE_OTHER): Payer: 59 | Admitting: Psychiatry

## 2023-04-25 ENCOUNTER — Encounter: Payer: Self-pay | Admitting: Psychiatry

## 2023-04-25 DIAGNOSIS — F411 Generalized anxiety disorder: Secondary | ICD-10-CM | POA: Diagnosis not present

## 2023-04-25 DIAGNOSIS — G4700 Insomnia, unspecified: Secondary | ICD-10-CM

## 2023-04-25 DIAGNOSIS — F410 Panic disorder [episodic paroxysmal anxiety] without agoraphobia: Secondary | ICD-10-CM

## 2023-04-25 MED ORDER — LORAZEPAM 0.5 MG PO TABS: ORAL_TABLET | ORAL | 5 refills | Status: AC

## 2023-04-25 MED ORDER — ESCITALOPRAM OXALATE 10 MG PO TABS: 15.0000 mg | ORAL_TABLET | Freq: Every day | ORAL | 1 refills | Status: DC

## 2023-04-25 MED ORDER — ESZOPICLONE 3 MG PO TABS: 3.0000 mg | ORAL_TABLET | Freq: Every day | ORAL | 3 refills | Status: DC

## 2023-04-25 NOTE — Progress Notes (Signed)
Patricia Shields [id number] 22-Jan-1966 57 y.o.  Subjective:   Patient ID:  Patricia Shields is a 57 y.o. (DOB [birth year]) female.  Chief Complaint:  Chief Complaint  Patient presents with   Follow-up    Anxiety, depression, and insomnia    HPI Patricia Shields presents to the office today for follow-up of depression, anxiety, and insomnia. She reports that her husband's treatments were paused due to some complications. She reports, that she will "still get frightened" in response to changes in her husband's condition. She reports that overall her anxiety is manageable. She reports Lorazepam has helped her with "the dark thoughts" and has helped her focus. Denies full blown panic attacks. She had a period where she was not sleeping well and was "ready to get things done."  She reports that she would sleep 5-6 hours a night and now is back to sleeping 7-8 hours. Denies depression. She reports energy and motivation are ok. Appetite has been good. Concentration has been good. Denies SI.   Work has been going well.   Lorazepam last filled 03/11/23.  Lunesta last filled 04/09/23.   Past Psychiatric Medication Trials: Lunesta- Effective and well-tolerated Ambien- Effective for sleep. Felt medicated the next day Trazodone- Ineffective Lexapro- Has been helpful for anxiety. She started on 10 mg  Sertraline- Took 100 mg long-term. May have helped and then no longer seemed to be effective. Denies side effects Citalopram- Not helpful for anxiety.  Cymbalta- Adverse reaction- "felt like I was coming out of my skin" Buspar-increased irritability Hydroxyzine- caused irritability Gabapentin- Prescribed but did not take Topamax Alprazolam-Helpful for panic. Denies side effects. Lorazepam  PHQ2-9    Flowsheet Row Office Visit from 03/10/2023 in [facility] Of [place]  PHQ-2 Total Score 0        Review of Systems:  Review of Systems   Gastrointestinal: Negative.   Musculoskeletal:  Positive for arthralgias and back pain. Negative for gait problem.       She reports some improved arthralgia with PT  Neurological:  Negative for tremors.  Psychiatric/Behavioral:         Please refer to HPI    Medications: I have reviewed the patient's current medications.  Current Outpatient Medications  Medication Sig Dispense Refill   atenolol (TENORMIN) 25 MG tablet Take 25 mg by mouth daily.     clobetasol cream (TEMOVATE) 0.05 %      Cyanocobalamin (VITAMIN B 12 PO) Take by mouth.     D3-50 1.25 MG (50000 UT) capsule Take 50,000 Units by mouth once a week.     Eszopiclone 3 MG TABS Take 1 tablet (3 mg total) by mouth at bedtime as needed. 30 tablet 2   [START ON 07/02/2023] Eszopiclone 3 MG TABS Take 1 tablet (3 mg total) by mouth at bedtime. Take immediately before bedtime 30 tablet 3   fluticasone-salmeterol (ADVAIR DISKUS) 250-50 MCG/ACT AEPB INHALE 1 PUFF INTO THE LUNGS TWICE A DAY FOR 30 DAYS     pantoprazole (PROTONIX) 40 MG tablet Take 40 mg by mouth daily.     potassium chloride (MICRO-K) 10 MEQ CR capsule Take 10 mEq by mouth daily.     simvastatin (ZOCOR) 20 MG tablet Take 1 tablet by mouth daily.     baclofen (LIORESAL) 10 MG tablet Take 1 tablet (10 mg total) by mouth 3 (three) times daily. (Patient not taking: Reported on 04/25/2023) 30 each 0   escitalopram (  LEXAPRO) 10 MG tablet Take 1.5 tablets (15 mg total) by mouth daily. 135 tablet 1   LORazepam (ATIVAN) 0.5 MG tablet TAKE 1/2-1 TABLET BY MOUTH EVERY 6 HOURS AS NEEDED FOR ANXIETY 45 tablet 5   No current facility-administered medications for this visit.    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   Please see review of systems for further details on the patient's review from today.   Objective:   Physical Exam:  LMP 02/21/2011   Physical Exam Constitutional:      General: She is not in acute distress. Musculoskeletal:        General: No deformity.  Neurological:     Mental Status: She is alert and oriented to person, place, and time.     Coordination: Coordination normal.  Psychiatric:        Attention and Perception: Attention and perception normal. She does not perceive auditory or visual hallucinations.        Mood and Affect: Affect is not labile, blunt, angry or inappropriate.        Speech: Speech normal.        Behavior: Behavior normal.        Thought Content: Thought content normal. Thought content is not paranoid or delusional. Thought content does not include homicidal or suicidal ideation. Thought content does not include homicidal or suicidal plan.        Cognition and Memory: Cognition and memory normal.        Judgment: Judgment normal.     Comments: Insight intact Mood is appropriate to content and affect is congruent     Lab Review:     Component Value Date/Time   NA 140 03/10/2023 1450   K 3.6 03/10/2023 1450   CL 108 03/10/2023 1450   CO2 24 03/10/2023 1450   GLUCOSE 116 (H) 03/10/2023 1450   BUN 17 03/10/2023 1450   CREATININE 0.56 03/10/2023 1450   CALCIUM 8.8 (L) 03/10/2023 1450   PROT 6.8 03/10/2023 1450   ALBUMIN 4.3 03/10/2023 1450   AST 20 03/10/2023 1450   ALT 17  03/10/2023 1450   ALKPHOS 59 03/10/2023 1450   BILITOT 0.5 03/10/2023 1450   GFRNONAA >60 03/10/2023 1450   GFRAA >90 03/18/2011 0515       Component Value Date/Time   WBC 8.8 03/10/2023 1450   RBC 3.86 (L) 03/10/2023 1450   RBC 3.90 03/10/2023 1450   HGB 13.3 03/10/2023 1450   HGB 13.7 12/20/2018 1549   HCT 38.4 03/10/2023 1450   HCT 39.4 12/20/2018 1549   PLT 262 03/10/2023 1450   PLT 254  12/20/2018 1549   MCV 98.5 03/10/2023 1450   MCV 99 (H) 12/20/2018 1549   MCH 34.1 (H) 03/10/2023 1450   MCHC 34.6 03/10/2023 1450   RDW 11.5 03/10/2023 1450   RDW 11.9 12/20/2018 1549   LYMPHSABS 2.9 03/10/2023 1450   MONOABS 0.5 03/10/2023 1450   EOSABS 0.3 03/10/2023 1450   BASOSABS 0.1 03/10/2023 1450    No results found for: "POCLITH", "LITHIUM"   No results found for: "PHENYTOIN", "PHENOBARB", "VALPROATE", "CBMZ"   .res Assessment: Plan:   34 minutes spent dedicated to the care of this patient on the date of this encounter to include pre-visit review of records, ordering of medication, post visit documentation, and face-to-face time with the patient discussing mood and anxiety symptoms in response to situational stressors. She reports that medications are adequately controlling symptoms at this time. She reports that Lexapro 12.5 mg daily has been effective and well- tolerated. She reports that 5 mg tablets are difficult to split and that she has been taking 1/4 of a 10 mg tablet along with an additional 10 mg to equal 12.5 mg dose. Discussed option to change to Lexapro 10 mg 1.5 tablets. Pt reports that she would prefer this over combination of 10 mg and 5 mg tablets. Continue Lorazepam 0.5 mg 1/2-1 tablet by mouth every 6 hours as needed for anxiety.  Continue Lunesta 3 mg at bedtime for insomnia.  Patient advised to contact office with any questions, adverse effects, or acute worsening in signs and symptoms.   Patricia Shields was seen today for follow-up.  Diagnoses and all orders  for this visit:  Generalized anxiety disorder -     escitalopram (LEXAPRO) 10 MG tablet; Take 1.5 tablets (15 mg total) by mouth daily.  Panic disorder -     escitalopram (LEXAPRO) 10 MG tablet; Take 1.5 tablets (15 mg total) by mouth daily. -     LORazepam (ATIVAN) 0.5 MG tablet; TAKE 1/2-1 TABLET BY MOUTH EVERY 6 HOURS AS NEEDED FOR ANXIETY  Insomnia, unspecified type -     Eszopiclone 3 MG TABS; Take 1 tablet (3 mg total) by mouth at bedtime. Take immediately before bedtime     Please see After Visit Summary for patient specific instructions.  No future appointments.  No orders of the defined types were placed in this encounter.   -------------------------------

## 2023-11-09 ENCOUNTER — Telehealth: Payer: Self-pay

## 2023-11-09 NOTE — Telephone Encounter (Signed)
 Patient call has been returned to schedule her colonoscopy.  Her colonoscopy is not yet due.  She was concerned about not having Dr.Vanga to do her procedure, and questioned what would she do if she had any GI Issues where she needed to see a provider.  Explained to her that Dr. Ole Berkeley would be able to perform her colonoscopy for her on Tue/Thur in August and we are anticipating having a provider in office by August.  I offered to call her back in August with an update and schedule her colonoscopy.  She appreciated this because she would prefer to stay with our office.  Reminder sent to myself to call her back closer to August.  Thanks,  [place], [place]

## 2023-11-14 ENCOUNTER — Encounter: Payer: Self-pay | Admitting: *Deleted

## 2023-11-16 ENCOUNTER — Encounter: Payer: Self-pay | Admitting: Internal Medicine

## 2023-12-11 ENCOUNTER — Other Ambulatory Visit: Payer: Self-pay

## 2023-12-11 ENCOUNTER — Telehealth: Payer: Self-pay

## 2023-12-11 DIAGNOSIS — Z860100 Personal history of colon polyps, unspecified: Secondary | ICD-10-CM

## 2023-12-11 MED ORDER — NA SULFATE-K SULFATE-MG SULF 17.5-3.13-1.6 GM/177ML PO SOLN: 1.0000 | Freq: Once | ORAL | 0 refills | Status: AC

## 2023-12-11 NOTE — Telephone Encounter (Signed)
 Gastroenterology Pre-Procedure Review  Request Date: 02/19/24 Requesting Physician: Dr. Jinny  PATIENT REVIEW QUESTIONS: The patient responded to the following health history questions as indicated:    1. Are you having any GI issues? no 2. Do you have a personal history of Polyps? yes (last colonoscopy w/ Dr. Unk 01/04/2019 recommended repeat in 5 years) 3. Do you have a family history of Colon Cancer or Polyps? no 4. Diabetes Mellitus? no 5. Joint replacements in the past 12 months?no 6. Major health problems in the past 3 months?no 7. Any artificial heart valves, MVP, or defibrillator?no    MEDICATIONS & ALLERGIES:    Patient reports the following regarding taking any anticoagulation/antiplatelet therapy:   Plavix, Coumadin, Eliquis, Xarelto, Lovenox, Pradaxa, Brilinta, or Effient? no Aspirin? no  Patient confirms/reports the following medications:  Current Outpatient Medications  Medication Sig Dispense Refill   Na Sulfate-K Sulfate-Mg Sulfate concentrate (SUPREP) 17.5-3.13-1.6 GM/177ML SOLN Take 1 kit (354 mLs total) by mouth once for 1 dose. 354 mL 0   atenolol (TENORMIN) 25 MG tablet Take 25 mg by mouth daily.     baclofen  (LIORESAL ) 10 MG tablet Take 1 tablet (10 mg total) by mouth 3 (three) times daily. (Patient not taking: Reported on 04/25/2023) 30 each 0   clobetasol cream (TEMOVATE) 0.05 %      Cyanocobalamin  (VITAMIN B 12 PO) Take by mouth.     D3-50 1.25 MG (50000 UT) capsule Take 50,000 Units by mouth once a week.     escitalopram  (LEXAPRO ) 10 MG tablet Take 1.5 tablets (15 mg total) by mouth daily. 135 tablet 1   Eszopiclone  3 MG TABS Take 1 tablet (3 mg total) by mouth at bedtime as needed. 30 tablet 2   Eszopiclone  3 MG TABS Take 1 tablet (3 mg total) by mouth at bedtime. Take immediately before bedtime 30 tablet 3   fluticasone-salmeterol (ADVAIR DISKUS) 250-50 MCG/ACT AEPB INHALE 1 PUFF INTO THE LUNGS TWICE A DAY FOR 30 DAYS     LORazepam  (ATIVAN ) 0.5 MG tablet  TAKE 1/2-1 TABLET BY MOUTH EVERY 6 HOURS AS NEEDED FOR ANXIETY 45 tablet 5   pantoprazole  (PROTONIX ) 40 MG tablet Take 40 mg by mouth daily.     potassium chloride (MICRO-K) 10 MEQ CR capsule Take 10 mEq by mouth daily.     simvastatin  (ZOCOR ) 20 MG tablet Take 1 tablet by mouth daily.     No current facility-administered medications for this visit.    Patient confirms/reports the following allergies:  Allergies  Allergen Reactions   Bactrim     Codeine Other (See Comments)    Reaction: causes accelerated heart rate    Demerol Other (See Comments)    Reaction: causes accelerated heart rate    Doxycycline  Hyclate Other (See Comments)    doxycycline  hyclate   Meperidine Palpitations    No orders of the defined types were placed in this encounter.   AUTHORIZATION INFORMATION Primary Insurance: 1D#: Group #:  Secondary Insurance: 1D#: Group #:  SCHEDULE INFORMATION: Date: 02/19/24 Time: Location: [facility]

## 2023-12-19 ENCOUNTER — Telehealth: Payer: Self-pay

## 2023-12-19 NOTE — Telephone Encounter (Signed)
 Patient called asking to change her procedure date. She decided to change it to 03/28/2024. I then called the endo unit and spoke to Trish to let her know.

## 2023-12-22 ENCOUNTER — Encounter: Payer: Self-pay | Admitting: Internal Medicine

## 2024-01-08 ENCOUNTER — Telehealth: Payer: Self-pay

## 2024-01-08 NOTE — Telephone Encounter (Signed)
 Patient called wanting to know if she was able to add an EGD on her colonoscopy scheduled on 03/28/2024. Patient stated that she has been having some acid reflux and some burning in her upper chest after eating certain foods. I told her that I would have to ask Dr. Jinny first and then call her back to let her know. Patient understood. Please advise.

## 2024-01-10 NOTE — Telephone Encounter (Signed)
 EGD added per Dr Felicitas verbal order... Due to being at the max time on the current day, pt was r/s to another day... New PPW mailed and released to Mychart

## 2024-01-15 ENCOUNTER — Telehealth: Payer: Self-pay

## 2024-01-15 NOTE — Telephone Encounter (Signed)
 Insurance has denied coverage for EGD due to lack of up-to-date clinical notes to support  Pt is aware and expressed understanding  I called Endo spoke to Trish to have EGD removed

## 2024-01-29 ENCOUNTER — Telehealth: Payer: Self-pay | Admitting: Nurse Practitioner

## 2024-01-29 ENCOUNTER — Other Ambulatory Visit: Payer: Self-pay | Admitting: Internal Medicine

## 2024-01-29 DIAGNOSIS — E782 Mixed hyperlipidemia: Secondary | ICD-10-CM

## 2024-01-29 MED ORDER — SIMVASTATIN 20 MG PO TABS: 20.0000 mg | ORAL_TABLET | Freq: Every day | ORAL | 1 refills | Status: DC

## 2024-01-29 NOTE — Telephone Encounter (Signed)
 Pt called in regards to wondering if she could get her cholestrolol meds sent in even though she has not yet been seen

## 2024-02-09 DIAGNOSIS — Z720 Tobacco use: Secondary | ICD-10-CM | POA: Insufficient documentation

## 2024-02-09 DIAGNOSIS — E7800 Pure hypercholesterolemia, unspecified: Secondary | ICD-10-CM | POA: Insufficient documentation

## 2024-02-15 ENCOUNTER — Other Ambulatory Visit: Payer: Self-pay

## 2024-02-15 DIAGNOSIS — J300 Vasomotor rhinitis: Secondary | ICD-10-CM | POA: Insufficient documentation

## 2024-02-15 DIAGNOSIS — R059 Cough, unspecified: Secondary | ICD-10-CM | POA: Insufficient documentation

## 2024-02-15 DIAGNOSIS — F17200 Nicotine dependence, unspecified, uncomplicated: Secondary | ICD-10-CM | POA: Insufficient documentation

## 2024-02-15 DIAGNOSIS — R052 Subacute cough: Secondary | ICD-10-CM | POA: Insufficient documentation

## 2024-02-19 ENCOUNTER — Other Ambulatory Visit: Payer: Self-pay | Admitting: Internal Medicine

## 2024-02-22 ENCOUNTER — Other Ambulatory Visit: Payer: Self-pay | Admitting: Internal Medicine

## 2024-02-22 DIAGNOSIS — E782 Mixed hyperlipidemia: Secondary | ICD-10-CM

## 2024-02-22 NOTE — Progress Notes (Signed)
 02/23/2024 Patricia Shields [id number] 05-03-1966  Gastroenterology Office Note    Primary Care Physician:  Weyman Bright, MD  Primary GI Provider: Jinny Carmine, MD    Chief Complaint   Chief Complaint  Patient presents with   New Patient (Initial Visit)    Upper gastric pain-has reflux-constipation-takes colace-hx hemorrhoids - sometimes after hard stool BM will have rectal bleeding     History of Present Illness   Patricia Shields is a 58 y.o. female with history of hemorrhoids and constipation, presenting today with epigastric pain, rectal bleeding, and reflux.  Patient reports she has been having epigastric pain 3 days a week.  Pain occurs 1 to 2 hours after eating.  Patient reports she been taking Protonix  once a day for several years and takes it after she eats lunch.  She receives Protonix  through insurance mail order. Also having occasional regurgitation and belching.  States if she eats late at night then she will have heartburn pain that next morning.  She consumes 2 cups of coffee in the morning.  She has an adjustable bed so she is able to elevate at night. She is taking ibuprofen  600 mg 1-2 a day, prescribed by rheumatology.  Rare alcohol use. Denies vomiting, dysphagia, nausea.   Patient reports seeing bright red blood when she wipes once every 2 to 3 weeks.  She has a bowel movement every 1 to 2 days.  She takes a stool softener most nights, when she forgets to take it her stools are hard and she has to strain.  Denies rectal pain. She drinks 5-6 bottles of water a day. She does not eat high-fiber foods or take fiber supplements.   Colonoscopy scheduled for 04/15/2024.   09/04/2018 colonoscopy - One 5 mm polyp in the cecum, removed with a cold biopsy forceps. Resected and retrieved. TUBULAR ADENOMA. - NEGATIVE FOR HIGH-GRADE DYSPLASIA AND MALIGNANCY.  - Non-bleeding external hemorrhoids. -Repeat colonoscopy in 5 years for surveillance.   Past Medical  History:  Diagnosis Date   Abscess of vulva 11/28/2022   Anxiety    Asthma    advair inhaler as needed   Carpal tunnel syndrome    hand brace prn   Cervical disc disorder with radiculopathy 10/26/2020   Chronic recurrent sinusitis 06/07/2020   Cough 02/15/2024   Disorder of vagina 04/29/2020   Disorder of vagina 04/29/2020   Fibroids    Fibroma of mouth 08/26/2021   GERD (gastroesophageal reflux disease)    occasional-no meds   Hand pain 08/26/2021   Hypercholesteremia    Hypercholesterolemia 02/09/2024   Intermittent palpitations 05/09/2020   Mean red blood cell volume increased 05/09/2020   Mean red blood cell volume increased 05/09/2020   Mixed anxiety and depressive disorder 06/07/2020   Nasal vestibulitis 06/07/2020   Nicotine dependence, unspecified, uncomplicated 02/15/2024   Osteoarthritis 08/26/2021   Ovarian cyst, left    Pernio 08/26/2021   Psoriasis 08/26/2021   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   Psoriatic arthritis (HCC)    Raynaud disease    Sinus tachycardia 05/09/2020   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, left    Subacute cough 02/15/2024   Tinnitus of both ears 08/26/2021   Tobacco user 02/09/2024   Vasomotor rhinitis 02/15/2024   Vitamin D deficiency     Past Surgical History:  Procedure Laterality Date   ANKLE FRACTURE SURGERY     APPENDECTOMY     COLONOSCOPY WITH PROPOFOL  N/A 01/04/2019   Procedure: COLONOSCOPY  WITH PROPOFOL ;  Surgeon: Unk Corinn Skiff, MD;  Location: [facility] ENDOSCOPY;  Service: Gastroenterology;  Laterality: N/A;   CYSTECTOMY     LAPAROSCOPIC HYSTERECTOMY  2012   SALPINGOOPHORECTOMY  03/17/2011   Procedure: SALPINGO OOPHERECTOMY;  Surgeon: Dickie DELENA Carder, MD;  Location: [facility] ORS;  Service: Gynecology;  Laterality: Left;   TONSILLECTOMY      Current Outpatient Medications  Medication Sig Dispense Refill   atenolol (TENORMIN) 25 MG tablet Take 25 mg by mouth daily.     Azelastine HCl 137  MCG/SPRAY SOLN      clobetasol cream (TEMOVATE) 0.05 %      escitalopram  (LEXAPRO ) 10 MG tablet Take 1.5 tablets (15 mg total) by mouth daily. 135 tablet 1   Eszopiclone  3 MG TABS Take 1 tablet (3 mg total) by mouth at bedtime as needed. 30 tablet 2   fluticasone-salmeterol (ADVAIR DISKUS) 250-50 MCG/ACT AEPB INHALE 1 PUFF INTO THE LUNGS TWICE A DAY FOR 30 DAYS     hydrochlorothiazide (HYDRODIURIL) 25 MG tablet TAKE 1 TABLET BY MOUTH EVERY DAY 90 tablet 0   LORazepam  (ATIVAN ) 0.5 MG tablet TAKE 1/2-1 TABLET BY MOUTH EVERY 6 HOURS AS NEEDED FOR ANXIETY 45 tablet 5   OTEZLA 30 MG TABS Take 1 tablet by mouth 2 (two) times daily.     pantoprazole  (PROTONIX ) 40 MG tablet Take 40 mg by mouth daily.     simvastatin  (ZOCOR ) 20 MG tablet TAKE 1 TABLET BY MOUTH EVERY DAY 90 tablet 1   No current facility-administered medications for this visit.    Allergies as of 02/23/2024 - Review Complete 02/23/2024  Allergen Reaction Noted   Bactrim   08/20/2011   Codeine Other (See Comments) 03/02/2011   Demerol Other (See Comments) 03/02/2011   Doxycycline  hyclate Other (See Comments) 12/11/2023   Sulfa  antibiotics Other (See Comments) 02/12/2024   Meperidine Palpitations 12/20/2018    Family History  Problem Relation Age of Onset   Dementia Mother    Raynaud syndrome Mother    Bladder Cancer Father    Diabetes Father    Multiple sclerosis Sister     Social History   Socioeconomic History   Marital status: Married    Spouse name: Not on file   Number of children: Not on file   Years of education: Not on file   Highest education level: Not on file  Occupational History   Not on file  Tobacco Use   Smoking status: Every Day    Current packs/day: 0.50    Average packs/day: 0.5 packs/day for 20.0 years (10.0 ttl pk-yrs)    Types: Cigarettes   Smokeless tobacco: Never   Tobacco comments:    Attempting to stop smoking  Vaping Use   Vaping status: Never Used  Substance and Sexual Activity    Alcohol use: Yes    Comment: occ   Drug use: No   Sexual activity: Yes  Other Topics Concern   Not on file  Social History Narrative   Not on file   Social Drivers of Health   Financial Resource Strain: Low Risk  (01/10/2024)   Received from [facility]   Overall Financial Resource Strain (CARDIA)    Difficulty of Paying Living Expenses: Not hard at all  Food Insecurity: No Food Insecurity (01/10/2024)   Received from [facility]   Hunger Vital Sign    Within the past 12 months, you worried that your food would run out before you got the money to  buy more.: Never true    Within the past 12 months, the food you bought just didn't last and you didn't have money to get more.: Never true  Transportation Needs: No Transportation Needs (01/10/2024)   Received from [facility] - Transportation    In the past 12 months, has lack of transportation kept you from medical appointments or from getting medications?: No    Lack of Transportation (Non-Medical): No  Physical Activity: Not on file  Stress: Not on file  Social Connections: Unknown (03/07/2022)   Received from [facility]   Social Network    Social Network: Not on file  Intimate Partner Violence: Not At Risk (03/10/2023)   Humiliation, Afraid, Rape, and Kick questionnaire    Fear of Current or Ex-Partner: No    Emotionally Abused: No    Physically Abused: No    Sexually Abused: No     RELEVANT GI HISTORY, IMAGING AND LABS: 01/10/2024: B12 428   03/10/2023: Ast 20, alt 17, Hgb 13.3  Review of Systems   All systems reviewed and negative except where noted in HPI.    Physical Exam  BP 107/68   Pulse 71   Temp 98.7 F (37.1 C)   Ht 5' 5.2 (1.656 m)   Wt 214 lb 3.2 oz (97.2 kg)   LMP 02/21/2011   SpO2 94%   BMI 35.43 kg/m  Patient's last menstrual period was 02/21/2011. General:   Alert and oriented. Pleasant and cooperative. Well-nourished and  well-developed. NAD. Head:  Normocephalic and atraumatic. Eyes:  Without icterus Ears:  Normal auditory acuity. Neck:  Supple; no masses or thyromegaly. Lungs:  Respirations even and unlabored.  Clear throughout to auscultation.   No wheezes, crackles, or rhonchi. No acute distress. Heart:  Regular rate and rhythm; no murmurs, clicks, rubs, or gallops. Abdomen:  Normal bowel sounds.  No bruits.  Epigastric tenderness noted. Soft,non-distended without masses, hepatosplenomegaly or hernias noted.  No guarding or rebound tenderness.   Rectal:  Deferred. Msk:  Symmetrical without gross deformities. Normal posture. Extremities:  Without edema. Neurologic:  Alert and  oriented x4;  grossly normal neurologically. Skin:  Intact without significant lesions or rashes. Psych:  Alert and cooperative. Normal mood and affect.   Assessment & Plan   Patricia Shields is a 58 y.o. female presenting today with epigastric pain, intermittent rectal bleeding, and reflux.  Epigastric pain with associated GERD symptoms. - Continue taking Protonix  40 mg once daily.  Make sure to take this first thing in the morning prior to meals.  -Lifestyle changes discussed, avoid caffeine and NSAIDS, eat 3 hours before bed. - Plan for EGD to evaluate GERD, esophagitis, hiatal hernia,H pylori, (esophageal strictures, inflammation, tumors). I discussed risks of EGD with patient today, including risk of sedation, bleeding or perforation. Patient provides understanding and gave verbal consent to proceed.  Rectal bleeding.  Occasional bright red blood when having to strain. History of hemorrhoids - Recommend High Fiber diet with fruits, vegetables, and whole grains.  - Continue Colace. - Start Miralax Mix 1 capful in a drink daily. - Start Benefiber Mix 1 TBSP in a drink daily. - Limit toilet time to 2-3 minutes.  - Colonoscopy scheduled for 04/15/2024. Further recommendations pending procedure.   Follow-up in 8  weeks  Grayce Bohr, DNP, AGNP-C [facility]

## 2024-02-23 ENCOUNTER — Telehealth: Payer: Self-pay

## 2024-02-23 ENCOUNTER — Encounter: Payer: Self-pay | Admitting: Family Medicine

## 2024-02-23 ENCOUNTER — Ambulatory Visit: Admitting: Family Medicine

## 2024-02-23 VITALS — BP 107/68 | HR 71 | Temp 98.7°F | Ht 65.2 in | Wt 214.2 lb

## 2024-02-23 DIAGNOSIS — K625 Hemorrhage of anus and rectum: Secondary | ICD-10-CM | POA: Diagnosis not present

## 2024-02-23 DIAGNOSIS — R1013 Epigastric pain: Secondary | ICD-10-CM

## 2024-02-23 DIAGNOSIS — K219 Gastro-esophageal reflux disease without esophagitis: Secondary | ICD-10-CM

## 2024-02-23 NOTE — Telephone Encounter (Signed)
 Clinicals submitted via [employer] and Case # [id number] has been updated to pending clinical review

## 2024-02-23 NOTE — Patient Instructions (Signed)
 Take Protonix  30-60 min prior to eating.   Recommend High Fiber diet with fruits, vegetables, and whole grains. Start Miralax Mix 1/2-1 capful in a drink daily. Start Benefiber Mix 1 TBSP in a drink daily.

## 2024-02-26 NOTE — Telephone Encounter (Signed)
 Spoke to [employer], this pre cert was denied because the original is less than 45 days ago. I will have to submit under the previous  case# [id number]   Auth# [id number]  Clinicals to be faxed to [phone] with case ID and patient's name and DOB ATTN Reconsideration Team

## 2024-02-28 ENCOUNTER — Other Ambulatory Visit: Payer: Self-pay | Admitting: Family Medicine

## 2024-02-28 DIAGNOSIS — M4807 Spinal stenosis, lumbosacral region: Secondary | ICD-10-CM

## 2024-02-28 NOTE — Telephone Encounter (Signed)
 Authorization [id number] Case Number: [id number]                             Patient Name:Rallis, TERETHA CHALUPA DOB:19-Jul-1965 Status: Approved          P2P Status:      Referring Provider:WOHL, DARREN Referring Provider WEP:[id number] Approval Date:02/27/2024 12:00:00 AM Service Rniz:[id number] Service Description:EGD transoral diagnostic Site Name:      [facility] -- (Selected)   Site WEP:[id number] Site Address:   [address]  Site City:[place] Site State:[place] Site Spe:[id number] Start Date:01/10/2024 Expiration Date:08/25/2024 Date Last Updated:02/27/2024 10:26:33 PM

## 2024-02-29 ENCOUNTER — Other Ambulatory Visit: Payer: Self-pay | Admitting: Family Medicine

## 2024-02-29 DIAGNOSIS — M5412 Radiculopathy, cervical region: Secondary | ICD-10-CM

## 2024-03-04 ENCOUNTER — Ambulatory Visit: Payer: Self-pay | Admitting: Internal Medicine

## 2024-03-04 ENCOUNTER — Encounter: Payer: Self-pay | Admitting: Internal Medicine

## 2024-03-04 ENCOUNTER — Encounter: Payer: Self-pay | Admitting: Family Medicine

## 2024-03-04 VITALS — BP 122/78 | HR 75 | Ht 66.0 in | Wt 213.2 lb

## 2024-03-04 DIAGNOSIS — D7589 Other specified diseases of blood and blood-forming organs: Secondary | ICD-10-CM

## 2024-03-04 DIAGNOSIS — L4050 Arthropathic psoriasis, unspecified: Secondary | ICD-10-CM

## 2024-03-04 DIAGNOSIS — R7303 Prediabetes: Secondary | ICD-10-CM | POA: Insufficient documentation

## 2024-03-04 DIAGNOSIS — Z1231 Encounter for screening mammogram for malignant neoplasm of breast: Secondary | ICD-10-CM | POA: Insufficient documentation

## 2024-03-04 DIAGNOSIS — M797 Fibromyalgia: Secondary | ICD-10-CM | POA: Diagnosis not present

## 2024-03-04 DIAGNOSIS — B379 Candidiasis, unspecified: Secondary | ICD-10-CM | POA: Insufficient documentation

## 2024-03-04 DIAGNOSIS — T3695XA Adverse effect of unspecified systemic antibiotic, initial encounter: Secondary | ICD-10-CM | POA: Insufficient documentation

## 2024-03-04 DIAGNOSIS — E782 Mixed hyperlipidemia: Secondary | ICD-10-CM | POA: Diagnosis not present

## 2024-03-04 DIAGNOSIS — I10 Essential (primary) hypertension: Secondary | ICD-10-CM | POA: Diagnosis not present

## 2024-03-04 DIAGNOSIS — Z716 Tobacco abuse counseling: Secondary | ICD-10-CM | POA: Insufficient documentation

## 2024-03-04 DIAGNOSIS — Z13820 Encounter for screening for osteoporosis: Secondary | ICD-10-CM | POA: Insufficient documentation

## 2024-03-04 MED ORDER — FLONASE SENSIMIST 27.5 MCG/SPRAY NA SUSP: 2.0000 | Freq: Every day | NASAL | 12 refills | Status: DC

## 2024-03-04 MED ORDER — LORATADINE 10 MG PO TABS: 10.0000 mg | ORAL_TABLET | Freq: Every day | ORAL | 2 refills | Status: DC

## 2024-03-04 MED ORDER — FLUCONAZOLE 150 MG PO TABS: 150.0000 mg | ORAL_TABLET | Freq: Every day | ORAL | 0 refills | Status: DC

## 2024-03-04 NOTE — Progress Notes (Signed)
 New Patient Office Visit  Subjective   Patient ID: Patricia Shields, female    DOB: 1966-03-30  Age: 58 y.o. MRN: [id number]  CC:  Chief Complaint  Patient presents with   Establish Care    NPE    HPI Patricia Shields presents to establish care Previous Primary Care provider/office: Dr. Jadali  she does have additional concerns to discuss today.   Patient is here today to establish care with office as her PCP. She reports she is doing well today but has some concerns. She sees Psychiatrist, Rheumatology, Neurology, Podiatrist, Hematology, Dermatology who manage her chronic conditions. She has PMH that includes hyperlipidemia, GERD, HTN, seasonal allergies, psoriatic arthritis, anemia, fibromyalgia, s/p squamous cell skin cancer, hysterectomy for heavy bleeding and severe ovarian cysts, and spinal stenosis.  Patient reports she does not take calcium and vitamin D supplementation but states she drinks protein drinks that are supposed to be high in protein and vitamins and minerals. Encouraged patient to take OTC supplementation for calcium and vitamin D as protein shakes were recommended by her Rheumatologist to avoid. Patient reports she has reduced tobacco smoking to 6 cigarettes a day but has been smoking since she was a teenager. She denies ever having routine screening for lung cancer. Will order low dose chest CT to be completed. Patient reports colonoscopy, endoscopy and mammogram are both scheduled to be completed 03/2024. She is past due for DEXA scan as well. Will send order for DEXA.  Patient endorses nasal congestion with productive cough. Denies fever, chills. Reports 2 negative covid tests 48 hours apart. States her husband has had similar symptoms and also tested negative for covid. Patient reports Telemedicine appointment 03/03/24 and was prescribed antibiotics she has not started yet. Patient encouraged to start taking Claritin and Flonase nasal spray daily for  bilateral otitis effusions. Patient states she forgot to ask provider yesterday for Diflucan  as she gets yeast infections when she takes antibiotics. Will send in prescription of Diflucan .  Patient has had renal nodules that have been monitored closely but have not changed on imaging. Last CT was 2024 and recommended no further screening as nodules have remained unchanged.  She also endorses lumps that have recently appeared on left lower leg that she is concerned about. She denies redness, or warmth, pain with walking. Does endorse tenderness to touch. She states she has had squamous cell skin cancer removed from similar area on her leg. Nodules do not appear to be in vascular region or suggest DVT. Recommended patient discuss at upcoming appointments with Dermatology and Rheumatology. Encouraged patient to return if fever, chills, heat/redness appear in that area.   Patient has no additional complaints at this time. She denies headache, chest pain, shortness of breath, abdominal pain, nausea, vomiting, diarrhea, constipation at this time.     Outpatient Encounter Medications as of 03/04/2024  Medication Sig   amoxicillin-clavulanate (AUGMENTIN) 875-125 MG tablet Take 1 tablet by mouth 2 (two) times daily.   atenolol (TENORMIN) 25 MG tablet Take 25 mg by mouth daily.   Azelastine HCl 137 MCG/SPRAY SOLN    clobetasol cream (TEMOVATE) 0.05 %    escitalopram  (LEXAPRO ) 20 MG tablet Take 20 mg by mouth daily.   Eszopiclone  3 MG TABS Take 1 tablet (3 mg total) by mouth at bedtime as needed.   fluconazole  (DIFLUCAN ) 150 MG tablet Take 1 tablet (150 mg total) by mouth daily.   fluticasone (FLONASE SENSIMIST) 27.5 MCG/SPRAY nasal spray Place 2 sprays into the nose  daily.   fluticasone-salmeterol (ADVAIR DISKUS) 250-50 MCG/ACT AEPB INHALE 1 PUFF INTO THE LUNGS TWICE A DAY FOR 30 DAYS   loratadine (CLARITIN) 10 MG tablet Take 1 tablet (10 mg total) by mouth daily.   LORazepam  (ATIVAN ) 0.5 MG tablet  TAKE 1/2-1 TABLET BY MOUTH EVERY 6 HOURS AS NEEDED FOR ANXIETY (Patient taking differently: Take 0.5 mg by mouth as needed. TAKE 1/2-1 TABLET BY MOUTH EVERY 6 HOURS AS NEEDED FOR ANXIETY)   pantoprazole  (PROTONIX ) 40 MG tablet Take 40 mg by mouth daily.   simvastatin  (ZOCOR ) 20 MG tablet TAKE 1 TABLET BY MOUTH EVERY DAY   hydrochlorothiazide (HYDRODIURIL) 25 MG tablet TAKE 1 TABLET BY MOUTH EVERY DAY (Patient not taking: Reported on 03/04/2024)   OTEZLA 30 MG TABS Take 1 tablet by mouth 2 (two) times daily. (Patient not taking: Reported on 03/04/2024)   [DISCONTINUED] escitalopram  (LEXAPRO ) 10 MG tablet Take 1.5 tablets (15 mg total) by mouth daily. (Patient not taking: Reported on 03/04/2024)   No facility-administered encounter medications on file as of 03/04/2024.    Past Medical History:  Diagnosis Date   Abscess of vulva 11/28/2022   Anxiety    Asthma    advair inhaler as needed   Carpal tunnel syndrome    hand brace prn   Cervical disc disorder with radiculopathy 10/26/2020   Chronic recurrent sinusitis 06/07/2020   Cough 02/15/2024   Disorder of vagina 04/29/2020   Disorder of vagina 04/29/2020   Fibroids    Fibroma of mouth 08/26/2021   GERD (gastroesophageal reflux disease)    occasional-no meds   Hand pain 08/26/2021   Hypercholesteremia    Hypercholesterolemia 02/09/2024   Intermittent palpitations 05/09/2020   Mean red blood cell volume increased 05/09/2020   Mean red blood cell volume increased 05/09/2020   Mixed anxiety and depressive disorder 06/07/2020   Nasal vestibulitis 06/07/2020   Nicotine dependence, unspecified, uncomplicated 02/15/2024   Osteoarthritis 08/26/2021   Ovarian cyst, left    Pernio 08/26/2021   Psoriasis 08/26/2021   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   Psoriatic arthritis (HCC)    Raynaud disease    Sinus tachycardia 05/09/2020   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, left    Subacute cough 02/15/2024   Tinnitus of  both ears 08/26/2021   Tobacco user 02/09/2024   Vasomotor rhinitis 02/15/2024   Vitamin D deficiency     Past Surgical History:  Procedure Laterality Date   ANKLE FRACTURE SURGERY     APPENDECTOMY     COLONOSCOPY WITH PROPOFOL  N/A 01/04/2019   Procedure: COLONOSCOPY WITH PROPOFOL ;  Surgeon: Unk Corinn Skiff, MD;  Location[facility] ENDOSCOPY;  Service: Gastroenterology;  Laterality: N/A;   CYSTECTOMY     LAPAROSCOPIC HYSTERECTOMY  2012   SALPINGOOPHORECTOMY  03/17/2011   Procedure: SALPINGO OOPHERECTOMY;  Surgeon: Dickie DELENA Carder, MD;  Location: [facility] ORS;  Service: Gynecology;  Laterality: Left;   TONSILLECTOMY      Family History  Problem Relation Age of Onset   Dementia Mother    Raynaud syndrome Mother    Bladder Cancer Father    Diabetes Father    Multiple sclerosis Sister     Social History   Socioeconomic History   Marital status: Married    Spouse name: Not on file   Number of children: Not on file   Years of education: Not on file   Highest education level: Not on file  Occupational History   Not on file  Tobacco Use  Smoking status: Every Day    Current packs/day: 0.50    Average packs/day: 0.5 packs/day for 40.0 years (20.0 ttl pk-yrs)    Types: Cigarettes   Smokeless tobacco: Never   Tobacco comments:    Attempting to stop smoking  Vaping Use   Vaping status: Never Used  Substance and Sexual Activity   Alcohol use: Yes    Comment: occ   Drug use: No   Sexual activity: Yes  Other Topics Concern   Not on file  Social History Narrative   Not on file   Social Drivers of Health   Financial Resource Strain: Low Risk  (02/28/2024)   Received from [facility]   Overall Financial Resource Strain (CARDIA)    Difficulty of Paying Living Expenses: Not hard at all  Food Insecurity: No Food Insecurity (02/28/2024)   Received from [facility]   Hunger Vital Sign    Within the past 12 months, you worried that your  food would run out before you got the money to buy more.: Never true    Within the past 12 months, the food you bought just didn't last and you didn't have money to get more.: Never true  Transportation Needs: No Transportation Needs (02/28/2024)   Received from [facility] - Transportation    In the past 12 months, has lack of transportation kept you from medical appointments or from getting medications?: No    Lack of Transportation (Non-Medical): No  Physical Activity: Not on file  Stress: Not on file  Social Connections: Unknown (03/07/2022)   Received from [facility]   Social Network    Social Network: Not on file  Intimate Partner Violence: Not At Risk (03/10/2023)   Humiliation, Afraid, Rape, and Kick questionnaire    Fear of Current or Ex-Partner: No    Emotionally Abused: No    Physically Abused: No    Sexually Abused: No    Review of Systems  Constitutional: Negative.  Negative for chills, fever and malaise/fatigue.  HENT:  Positive for congestion and tinnitus. Negative for sore throat.   Eyes: Negative.  Negative for blurred vision and pain.  Respiratory: Negative.  Negative for cough and shortness of breath.   Cardiovascular: Negative.  Negative for chest pain, palpitations and leg swelling.  Gastrointestinal: Negative.  Negative for abdominal pain, blood in stool, constipation, diarrhea, heartburn, melena, nausea and vomiting.  Genitourinary: Negative.  Negative for dysuria, flank pain, frequency and urgency.  Musculoskeletal: Negative.  Negative for joint pain and myalgias.  Skin:  Positive for rash (psoriatic arthritis, active; tender bumps on left lower leg; hx oral mucosa fibromas 2023.).       Possible fibromas on left lower leg.  Neurological:  Positive for tingling (cervical radiculopathy). Negative for dizziness, sensory change, weakness and headaches.  Endo/Heme/Allergies: Negative.   Psychiatric/Behavioral: Negative.  Negative for  depression and suicidal ideas. The patient is not nervous/anxious.         Objective   BP 122/78   Pulse 75   Ht 5' 6 (1.676 m)   Wt 213 lb 3.2 oz (96.7 kg)   LMP 02/21/2011   SpO2 95%   BMI 34.41 kg/m   Physical Exam Vitals and nursing note reviewed.  Constitutional:      Appearance: Normal appearance.  HENT:     Head: Normocephalic and atraumatic.     Right Ear: A middle ear effusion is present.     Left  Ear: A middle ear effusion is present.     Nose: Nose normal.     Mouth/Throat:     Mouth: Mucous membranes are moist.     Pharynx: Oropharynx is clear. Posterior oropharyngeal erythema present.  Eyes:     Conjunctiva/sclera: Conjunctivae normal.     Pupils: Pupils are equal, round, and reactive to light.  Cardiovascular:     Rate and Rhythm: Normal rate and regular rhythm.     Pulses: Normal pulses.     Heart sounds: Normal heart sounds. No murmur heard. Pulmonary:     Effort: Pulmonary effort is normal.     Breath sounds: Normal breath sounds. No wheezing.  Abdominal:     General: Bowel sounds are normal.     Palpations: Abdomen is soft.     Tenderness: There is no abdominal tenderness. There is no right CVA tenderness or left CVA tenderness.  Musculoskeletal:        General: Normal range of motion.     Cervical back: Normal range of motion.     Right lower leg: No edema.     Left lower leg: No edema.  Skin:    General: Skin is warm and dry.     Findings: Rash present. Rash is scaling.     Comments: Multiple sites of psoriasis plaque; keratosis on right shoulder and behind right knee   Neurological:     General: No focal deficit present.     Mental Status: She is alert and oriented to person, place, and time.  Psychiatric:        Mood and Affect: Mood normal.        Behavior: Behavior normal.        Assessment & Plan:  Continue taking medications as prescribed. Referral for DEXA and low dose chest CT. Routine fasting labs to be collected today and  FU on results with patient. Start Diflucan  while on antibiotic for yeast infection. Problem List Items Addressed This Visit     Essential hypertension - Primary   Relevant Orders   CBC with Diff   CMP14+EGFR   Fibromyalgia, primary   Relevant Medications   escitalopram  (LEXAPRO ) 20 MG tablet   Other Relevant Orders   CBC with Diff   Hyperlipidemia   Relevant Orders   Lipid Panel w/o Chol/HDL Ratio   Psoriatic arthritis (HCC)   Macrocytosis   Relevant Orders   Vitamin B12   CBC with Diff   Prediabetes   Relevant Orders   Hemoglobin A1c   Encounter for smoking cessation counseling   Relevant Orders   CT CHEST LUNG CA SCREEN LOW DOSE W/O CM   Breast cancer screening by mammogram   Screening for osteoporosis   Relevant Orders   DG DXA FRACTURE ASSESSMENT   Antibiotic-induced yeast infection   Relevant Medications   fluconazole  (DIFLUCAN ) 150 MG tablet    Return in about 1 month (around 04/04/2024).   Total time spent: 45 minutes  FERNAND FREDY RAMAN, MD  03/04/2024   This document may have been prepared by [facility] Voice Recognition software and as such may include unintentional dictation errors.

## 2024-03-05 ENCOUNTER — Ambulatory Visit: Payer: Self-pay | Admitting: Internal Medicine

## 2024-03-05 DIAGNOSIS — E782 Mixed hyperlipidemia: Secondary | ICD-10-CM

## 2024-03-05 LAB — CBC WITH DIFFERENTIAL/PLATELET
Basophils Absolute: 0.1 x10E3/uL (ref 0.0–0.2)
Basos: 1 %
EOS (ABSOLUTE): 0.3 x10E3/uL (ref 0.0–0.4)
Eos: 4 %
Hematocrit: 41.6 % (ref 34.0–46.6)
Hemoglobin: 13.8 g/dL (ref 11.1–15.9)
Immature Grans (Abs): 0 x10E3/uL (ref 0.0–0.1)
Immature Granulocytes: 0 %
Lymphocytes Absolute: 2.6 x10E3/uL (ref 0.7–3.1)
Lymphs: 32 %
MCH: 34.2 pg — ABNORMAL HIGH (ref 26.6–33.0)
MCHC: 33.2 g/dL (ref 31.5–35.7)
MCV: 103 fL — ABNORMAL HIGH (ref 79–97)
Monocytes Absolute: 0.5 x10E3/uL (ref 0.1–0.9)
Monocytes: 7 %
Neutrophils Absolute: 4.5 x10E3/uL (ref 1.4–7.0)
Neutrophils: 56 %
Platelets: 243 x10E3/uL (ref 150–450)
RBC: 4.03 x10E6/uL (ref 3.77–5.28)
RDW: 11.9 % (ref 11.7–15.4)
WBC: 8 x10E3/uL (ref 3.4–10.8)

## 2024-03-05 LAB — CMP14+EGFR
ALT: 14 IU/L (ref 0–32)
AST: 14 IU/L (ref 0–40)
Albumin: 4.7 g/dL (ref 3.8–4.9)
Alkaline Phosphatase: 82 IU/L (ref 49–135)
BUN/Creatinine Ratio: 19 (ref 9–23)
BUN: 14 mg/dL (ref 6–24)
Bilirubin Total: 0.5 mg/dL (ref 0.0–1.2)
CO2: 24 mmol/L (ref 20–29)
Calcium: 10.1 mg/dL (ref 8.7–10.2)
Chloride: 102 mmol/L (ref 96–106)
Creatinine, Ser: 0.72 mg/dL (ref 0.57–1.00)
Globulin, Total: 2.2 g/dL (ref 1.5–4.5)
Glucose: 82 mg/dL (ref 70–99)
Potassium: 4.2 mmol/L (ref 3.5–5.2)
Sodium: 141 mmol/L (ref 134–144)
Total Protein: 6.9 g/dL (ref 6.0–8.5)
eGFR: 97 mL/min/1.73 (ref >59)

## 2024-03-05 LAB — HEMOGLOBIN A1C
Est. average glucose Bld gHb Est-mCnc: 111 mg/dL
Hgb A1c MFr Bld: 5.5 % (ref 4.8–5.6)

## 2024-03-05 LAB — LIPID PANEL W/O CHOL/HDL RATIO
Cholesterol, Total: 203 mg/dL — ABNORMAL HIGH (ref 100–199)
HDL: 56 mg/dL (ref >39)
LDL Chol Calc (NIH): 109 mg/dL — ABNORMAL HIGH (ref 0–99)
Triglycerides: 224 mg/dL — ABNORMAL HIGH (ref 0–149)
VLDL Cholesterol Cal: 38 mg/dL (ref 5–40)

## 2024-03-05 LAB — VITAMIN B12: Vitamin B-12: 605 pg/mL (ref 232–1245)

## 2024-03-05 MED ORDER — SIMVASTATIN 40 MG PO TABS: 40.0000 mg | ORAL_TABLET | Freq: Every evening | ORAL | 11 refills | Status: DC

## 2024-03-06 NOTE — Progress Notes (Signed)
 Patient notified

## 2024-03-13 ENCOUNTER — Encounter: Payer: Self-pay | Admitting: Internal Medicine

## 2024-03-21 ENCOUNTER — Ambulatory Visit
Admission: RE | Admit: 2024-03-21 | Discharge: 2024-03-21 | Disposition: A | Discharge status: Home / Self Care | Source: Ambulatory Visit | Attending: Internal Medicine | Admitting: Internal Medicine

## 2024-03-21 DIAGNOSIS — Z716 Tobacco abuse counseling: Secondary | ICD-10-CM | POA: Diagnosis present

## 2024-03-22 ENCOUNTER — Encounter: Payer: Self-pay | Admitting: Family Medicine

## 2024-03-26 ENCOUNTER — Other Ambulatory Visit: Payer: Self-pay

## 2024-03-26 MED ORDER — PANTOPRAZOLE SODIUM 40 MG PO TBEC: 40.0000 mg | DELAYED_RELEASE_TABLET | Freq: Every day | ORAL | 5 refills | Status: DC

## 2024-03-26 MED ORDER — PANTOPRAZOLE SODIUM 40 MG PO TBEC: 40.0000 mg | DELAYED_RELEASE_TABLET | Freq: Every day | ORAL | 1 refills | Status: AC

## 2024-03-26 NOTE — Telephone Encounter (Signed)
 Wants to know if you will refill the Pantoprazole , she can no longer get from PCP

## 2024-03-26 NOTE — Addendum Note (Signed)
 Addended by: LANNIE ANDREA GRADE on: 03/26/2024 11:10 AM   Modules accepted: Orders

## 2024-03-27 ENCOUNTER — Encounter: Payer: Self-pay | Admitting: Internal Medicine

## 2024-03-28 ENCOUNTER — Ambulatory Visit
Admission: RE | Admit: 2024-03-28 | Discharge: 2024-03-28 | Disposition: A | Discharge status: Home / Self Care | Source: Ambulatory Visit | Attending: Cardiology | Admitting: Cardiology

## 2024-03-28 ENCOUNTER — Encounter: Payer: Self-pay | Admitting: Cardiology

## 2024-03-28 ENCOUNTER — Ambulatory Visit: Admitting: Cardiology

## 2024-03-28 ENCOUNTER — Ambulatory Visit: Payer: Self-pay | Admitting: Cardiology

## 2024-03-28 ENCOUNTER — Ambulatory Visit
Admission: RE | Admit: 2024-03-28 | Discharge: 2024-03-28 | Disposition: A | Discharge status: Home / Self Care | Attending: Cardiology | Admitting: Cardiology

## 2024-03-28 VITALS — BP 120/82 | HR 76 | Ht 66.0 in | Wt 200.2 lb

## 2024-03-28 DIAGNOSIS — R0602 Shortness of breath: Secondary | ICD-10-CM | POA: Diagnosis present

## 2024-03-28 DIAGNOSIS — E782 Mixed hyperlipidemia: Secondary | ICD-10-CM

## 2024-03-28 DIAGNOSIS — I10 Essential (primary) hypertension: Secondary | ICD-10-CM

## 2024-03-28 DIAGNOSIS — Z716 Tobacco abuse counseling: Secondary | ICD-10-CM

## 2024-03-28 DIAGNOSIS — Z131 Encounter for screening for diabetes mellitus: Secondary | ICD-10-CM

## 2024-03-28 DIAGNOSIS — F17210 Nicotine dependence, cigarettes, uncomplicated: Secondary | ICD-10-CM

## 2024-03-28 NOTE — Progress Notes (Signed)
 Patient notified

## 2024-03-28 NOTE — Progress Notes (Signed)
 Established Patient Office Visit  Subjective:  Patient ID: Patricia Shields, female    DOB: 1966/02/24  Age: 58 y.o. MRN: [id number]  Chief Complaint  Patient presents with   Acute Visit    SOB, chest tightness, been experiencing the entire time since she had pneumonia a few weeks ago. PCP (khan) ordered a lung ct scan for smokers on her. Wet cough as well clear flem.    Patient in office for an acute visit. Patient complaining of shortness of breath and chest tightness. Patient recently treated for pneumonia. Patient reports shortness of breath has not improved.  Patient previously seen by cardiology. Recent low dose lung CT scan negative, age advanced left anterior descending coronary artery calcification. Will refer back to previous cardiologist.  Patient continues to smoke, understands importance to quit.  Blood pressure well controlled today.  Will order an x-ray to verify pneumonia has resolved.     No other concerns at this time.   Past Medical History:  Diagnosis Date   Abscess of vulva 11/28/2022   Anxiety    Asthma    advair inhaler as needed   Carpal tunnel syndrome    hand brace prn   Cervical disc disorder with radiculopathy 10/26/2020   Chronic recurrent sinusitis 06/07/2020   Cough 02/15/2024   Disorder of vagina 04/29/2020   Disorder of vagina 04/29/2020   Fibroids    Fibroma of mouth 08/26/2021   GERD (gastroesophageal reflux disease)    occasional-no meds   Hand pain 08/26/2021   Hypercholesteremia    Hypercholesterolemia 02/09/2024   Intermittent palpitations 05/09/2020   Mean red blood cell volume increased 05/09/2020   Mean red blood cell volume increased 05/09/2020   Mixed anxiety and depressive disorder 06/07/2020   Nasal vestibulitis 06/07/2020   Nicotine dependence, unspecified, uncomplicated 02/15/2024   Osteoarthritis 08/26/2021   Ovarian cyst, left    Pernio 08/26/2021   Psoriasis 08/26/2021   Psoriasis with arthropathy (HCC)  06/23/2017   Psoriatic arthritis (HCC)    Raynaud disease    Sinus tachycardia 05/09/2020   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, left    Subacute cough 02/15/2024   Tinnitus of both ears 08/26/2021   Tobacco user 02/09/2024   Vasomotor rhinitis 02/15/2024   Vitamin D deficiency     Past Surgical History:  Procedure Laterality Date   ANKLE FRACTURE SURGERY     APPENDECTOMY     COLONOSCOPY WITH PROPOFOL  N/A 01/04/2019   Procedure: COLONOSCOPY WITH PROPOFOL ;  Surgeon: Unk Corinn Skiff, MD;  Location: [facility];  Service: Gastroenterology;  Laterality: N/A;   CYSTECTOMY     LAPAROSCOPIC HYSTERECTOMY  2012   SALPINGOOPHORECTOMY  03/17/2011   Procedure: SALPINGO OOPHERECTOMY;  Surgeon: Dickie DELENA Carder, MD;  Location: [facility];  Service: Gynecology;  Laterality: Left;   TONSILLECTOMY      Social History   Socioeconomic History   Marital status: Married    Spouse name: Not on file   Number of children: Not on file   Years of education: Not on file   Highest education level: Not on file  Occupational History   Not on file  Tobacco Use   Smoking status: Every Day    Current packs/day: 0.50    Average packs/day: 0.5 packs/day for 40.0 years (20.0 ttl pk-yrs)    Types: Cigarettes   Smokeless tobacco: Never   Tobacco comments:    Attempting to stop smoking  Vaping Use   Vaping status: Never Used  Substance and Sexual Activity   Alcohol use: Yes    Comment: occ   Drug use: No   Sexual activity: Yes  Other Topics Concern   Not on file  Social History Narrative   Not on file   Social Drivers of Health   Financial Resource Strain: Low Risk  (02/28/2024)   Received from [facility]   Overall Financial Resource Strain (CARDIA)    Difficulty of Paying Living Expenses: Not hard at all  Food Insecurity: No Food Insecurity (02/28/2024)   Received from [facility]   Hunger Vital Sign    Within the past 12 months, you  worried that your food would run out before you got the money to buy more.: Never true    Within the past 12 months, the food you bought just didn't last and you didn't have money to get more.: Never true  Transportation Needs: No Transportation Needs (02/28/2024)   Received from [facility] - Transportation    In the past 12 months, has lack of transportation kept you from medical appointments or from getting medications?: No    Lack of Transportation (Non-Medical): No  Physical Activity: Not on file  Stress: Not on file  Social Connections: Unknown (03/07/2022)   Received from [facility]   Social Network    Social Network: Not on file  Intimate Partner Violence: Not At Risk (03/10/2023)   Humiliation, Afraid, Rape, and Kick questionnaire    Fear of Current or Ex-Partner: No    Emotionally Abused: No    Physically Abused: No    Sexually Abused: No    Family History  Problem Relation Age of Onset   Dementia Mother    Raynaud syndrome Mother    Bladder Cancer Father    Diabetes Father    Multiple sclerosis Sister     Allergies  Allergen Reactions   Bactrim     Codeine Other (See Comments)    Reaction: causes accelerated heart rate    Demerol Other (See Comments)    Reaction: causes accelerated heart rate    Doxycycline  Hyclate Other (See Comments)    doxycycline  hyclate   Sulfa  Antibiotics Other (See Comments)   Meperidine Palpitations    Outpatient Medications Prior to Visit  Medication Sig   atenolol (TENORMIN) 25 MG tablet Take 25 mg by mouth daily.   Azelastine HCl 137 MCG/SPRAY SOLN    clobetasol cream (TEMOVATE) 0.05 %    escitalopram  (LEXAPRO ) 20 MG tablet Take 20 mg by mouth daily.   Eszopiclone  3 MG TABS Take 1 tablet (3 mg total) by mouth at bedtime as needed.   fluticasone-salmeterol (ADVAIR DISKUS) 250-50 MCG/ACT AEPB INHALE 1 PUFF INTO THE LUNGS TWICE A DAY FOR 30 DAYS   pantoprazole  (PROTONIX ) 40 MG tablet Take 1 tablet  (40 mg total) by mouth daily.   simvastatin  (ZOCOR ) 40 MG tablet Take 1 tablet (40 mg total) by mouth every evening.   amoxicillin-clavulanate (AUGMENTIN) 875-125 MG tablet Take 1 tablet by mouth 2 (two) times daily. (Patient not taking: Reported on 03/28/2024)   fluconazole  (DIFLUCAN ) 150 MG tablet Take 1 tablet (150 mg total) by mouth daily. (Patient not taking: Reported on 03/28/2024)   fluticasone (FLONASE SENSIMIST) 27.5 MCG/SPRAY nasal spray Place 2 sprays into the nose daily. (Patient not taking: Reported on 03/28/2024)   hydrochlorothiazide (HYDRODIURIL) 25 MG tablet TAKE 1 TABLET BY MOUTH EVERY DAY (Patient not taking: Reported on 03/28/2024)   loratadine (  CLARITIN) 10 MG tablet Take 1 tablet (10 mg total) by mouth daily. (Patient not taking: Reported on 03/28/2024)   LORazepam  (ATIVAN ) 0.5 MG tablet TAKE 1/2-1 TABLET BY MOUTH EVERY 6 HOURS AS NEEDED FOR ANXIETY (Patient not taking: Reported on 03/28/2024)   OTEZLA 30 MG TABS Take 1 tablet by mouth 2 (two) times daily. (Patient not taking: Reported on 03/28/2024)   No facility-administered medications prior to visit.    Review of Systems  Constitutional: Negative.   HENT: Negative.    Eyes: Negative.   Respiratory:  Positive for cough and shortness of breath.   Cardiovascular:        Chest tightness  Gastrointestinal: Negative.  Negative for abdominal pain, constipation and diarrhea.  Genitourinary: Negative.   Musculoskeletal:  Negative for joint pain and myalgias.  Skin: Negative.   Neurological: Negative.  Negative for dizziness and headaches.  Endo/Heme/Allergies: Negative.   All other systems reviewed and are negative.      Objective:   BP 120/82   Pulse 76   Ht 5' 6 (1.676 m)   Wt 200 lb 3.2 oz (90.8 kg)   LMP 02/21/2011   SpO2 97%   BMI 32.31 kg/m   Vitals:   03/28/24 1449  BP: 120/82  Pulse: 76  Height: 5' 6 (1.676 m)  Weight: 200 lb 3.2 oz (90.8 kg)  SpO2: 97%  BMI (Calculated): 32.33    Physical  Exam Vitals and nursing note reviewed.  Constitutional:      Appearance: Normal appearance. She is normal weight.  HENT:     Head: Normocephalic and atraumatic.     Nose: Nose normal.     Mouth/Throat:     Mouth: Mucous membranes are moist.  Eyes:     Extraocular Movements: Extraocular movements intact.     Conjunctiva/sclera: Conjunctivae normal.     Pupils: Pupils are equal, round, and reactive to light.  Cardiovascular:     Rate and Rhythm: Normal rate and regular rhythm.     Pulses: Normal pulses.     Heart sounds: Normal heart sounds.  Pulmonary:     Effort: Pulmonary effort is normal.     Breath sounds: Normal breath sounds. No stridor. No wheezing, rhonchi or rales.  Abdominal:     General: Abdomen is flat. Bowel sounds are normal.     Palpations: Abdomen is soft.  Musculoskeletal:        General: Normal range of motion.     Cervical back: Normal range of motion.  Skin:    General: Skin is warm and dry.  Neurological:     General: No focal deficit present.     Mental Status: She is alert and oriented to person, place, and time.  Psychiatric:        Mood and Affect: Mood normal.        Behavior: Behavior normal.        Thought Content: Thought content normal.        Judgment: Judgment normal.      No results found for any visits on 03/28/24.  Recent Results (from the past 2160 hours)  Vitamin B12     Status: None   Collection Time: 03/04/24  1:56 PM  Result Value Ref Range   Vitamin B-12 605 232 - 1,245 pg/mL  Lipid Panel w/o Chol/HDL Ratio     Status: Abnormal   Collection Time: 03/04/24  1:56 PM  Result Value Ref Range   Cholesterol, Total 203 (H) 100 - 199  mg/dL   Triglycerides 775 (H) 0 - 149 mg/dL   HDL 56 >60 mg/dL   VLDL Cholesterol Cal 38 5 - 40 mg/dL   LDL Chol Calc (NIH) 890 (H) 0 - 99 mg/dL  Hemoglobin J8r     Status: None   Collection Time: 03/04/24  1:56 PM  Result Value Ref Range   Hgb A1c MFr Bld 5.5 4.8 - 5.6 %    Comment:           Prediabetes: 5.7 - 6.4          Diabetes: >6.4          Glycemic control for adults with diabetes: <7.0    Est. average glucose Bld gHb Est-mCnc 111 mg/dL  CBC with Diff     Status: Abnormal   Collection Time: 03/04/24  1:56 PM  Result Value Ref Range   WBC 8.0 3.4 - 10.8 x10E3/uL   RBC 4.03 3.77 - 5.28 x10E6/uL   Hemoglobin 13.8 11.1 - 15.9 g/dL   Hematocrit 58.3 65.9 - 46.6 %   MCV 103 (H) 79 - 97 fL   MCH 34.2 (H) 26.6 - 33.0 pg   MCHC 33.2 31.5 - 35.7 g/dL   RDW 88.0 88.2 - 84.5 %   Platelets 243 150 - 450 x10E3/uL   Neutrophils 56 Not Estab. %   Lymphs 32 Not Estab. %   Monocytes 7 Not Estab. %   Eos 4 Not Estab. %   Basos 1 Not Estab. %   Neutrophils Absolute 4.5 1.4 - 7.0 x10E3/uL   Lymphocytes Absolute 2.6 0.7 - 3.1 x10E3/uL   Monocytes Absolute 0.5 0.1 - 0.9 x10E3/uL   EOS (ABSOLUTE) 0.3 0.0 - 0.4 x10E3/uL   Basophils Absolute 0.1 0.0 - 0.2 x10E3/uL   Immature Granulocytes 0 Not Estab. %   Immature Grans (Abs) 0.0 0.0 - 0.1 x10E3/uL  CMP14+EGFR     Status: None   Collection Time: 03/04/24  1:56 PM  Result Value Ref Range   Glucose 82 70 - 99 mg/dL   BUN 14 6 - 24 mg/dL   Creatinine, Ser 9.27 0.57 - 1.00 mg/dL   eGFR 97 >40 fO/fpw/8.26   BUN/Creatinine Ratio 19 9 - 23   Sodium 141 134 - 144 mmol/L   Potassium 4.2 3.5 - 5.2 mmol/L   Chloride 102 96 - 106 mmol/L   CO2 24 20 - 29 mmol/L   Calcium 10.1 8.7 - 10.2 mg/dL   Total Protein 6.9 6.0 - 8.5 g/dL   Albumin 4.7 3.8 - 4.9 g/dL   Globulin, Total 2.2 1.5 - 4.5 g/dL   Bilirubin Total 0.5 0.0 - 1.2 mg/dL   Alkaline Phosphatase 82 49 - 135 IU/L   AST 14 0 - 40 IU/L   ALT 14 0 - 32 IU/L      Assessment & Plan:  Chest x-ray today. Referral sent to cardiology. Smoking cessation.   Problem List Items Addressed This Visit       Cardiovascular and Mediastinum   Essential hypertension   Relevant Orders   Ambulatory referral to Cardiology     Other   Hyperlipidemia   Relevant Orders   Ambulatory  referral to Cardiology   Encounter for smoking cessation counseling   Shortness of breath - Primary   Relevant Orders   Ambulatory referral to Cardiology   DG Chest 2 View (Completed)    Return if symptoms worsen or fail to improve, for as scheduled with NK next week.  Total time spent: 25 minutes. This time includes review of previous notes and results and patient face to face interaction during today's visit.    Jeoffrey Pollen, NP  03/28/2024   This document may have been prepared by Dragon Voice Recognition software and as such may include unintentional dictation errors.

## 2024-03-29 ENCOUNTER — Ambulatory Visit: Admitting: Cardiology

## 2024-04-01 ENCOUNTER — Encounter: Payer: Self-pay | Admitting: Cardiovascular Disease

## 2024-04-01 ENCOUNTER — Ambulatory Visit: Attending: Cardiovascular Disease | Admitting: Cardiovascular Disease

## 2024-04-01 VITALS — BP 114/72 | HR 83 | Ht 66.0 in | Wt 211.0 lb

## 2024-04-01 DIAGNOSIS — I10 Essential (primary) hypertension: Secondary | ICD-10-CM

## 2024-04-01 DIAGNOSIS — R0602 Shortness of breath: Secondary | ICD-10-CM | POA: Diagnosis not present

## 2024-04-01 DIAGNOSIS — R072 Precordial pain: Secondary | ICD-10-CM | POA: Diagnosis not present

## 2024-04-01 DIAGNOSIS — E782 Mixed hyperlipidemia: Secondary | ICD-10-CM | POA: Diagnosis not present

## 2024-04-01 MED ORDER — METOPROLOL TARTRATE 100 MG PO TABS
ORAL_TABLET | ORAL | 0 refills | Status: AC
Start: 2024-04-01 — End: ?

## 2024-04-01 NOTE — Patient Instructions (Addendum)
 Medication Instructions:  Your physician recommends that you continue on your current medications as directed. Please refer to the Current Medication list given to you today.  *If you need a refill on your cardiac medications before your next appointment, please call your pharmacy*  Testing/Procedures: Your physician has requested that you have an echocardiogram. Echocardiography is a painless test that uses sound waves to create images of your heart. It provides your doctor with information about the size and shape of your heart and how well your heart's chambers and valves are working. This procedure takes approximately one hour. There are no restrictions for this procedure. Please do NOT wear cologne, perfume, aftershave, or lotions (deodorant is allowed). Please arrive 15 minutes prior to your appointment time.  Please note: We ask at that you not bring children with you during ultrasound (echo/ vascular) testing. Due to room size and safety concerns, children are not allowed in the ultrasound rooms during exams. Our front office staff cannot provide observation of children in our lobby area while testing is being conducted. An adult accompanying a patient to their appointment will only be allowed in the ultrasound room at the discretion of the ultrasound technician under special circumstances. We apologize for any inconvenience.    Your cardiac CT will be scheduled at one of the below locations:   [facility] [address]  [place], [place] [address]  If scheduled at the [facility] at [address], please enter the parking lot using the [address] entrance and use the FREE valet service at the patient drop-off area. Enter the building and check-in with registration on the main floor.  Please follow these instructions carefully (unless otherwise directed):  An IV will be required for this test and Nitroglycerin will be given.   On the  Night Before the Test: Be sure to Drink plenty of water. Do not consume any caffeinated/decaffeinated beverages or chocolate 12 hours prior to your test. Do not take any antihistamines 12 hours prior to your test.  On the Day of the Test: Drink plenty of water until 1 hour prior to the test. Do not eat any food 1 hour prior to test. You may take your regular medications prior to the test.  HOLD Atenolol morning of test.  Take metoprolol (Lopressor) two hours prior to test. If you take Furosemide/Hydrochlorothiazide/Spironolactone/Chlorthalidone, please HOLD on the morning of the test. Patients who wear a continuous glucose monitor MUST remove the device prior to scanning. FEMALES- please wear underwire-free bra if available, avoid dresses & tight clothing       After the Test: Drink plenty of water. After receiving IV contrast, you may experience a mild flushed feeling. This is normal. On occasion, you may experience a mild rash up to 24 hours after the test. This is not dangerous. If this occurs, you can take Benadryl  25 mg, Zyrtec, Claritin, or Allegra and increase your fluid intake. (Patients taking Tikosyn should avoid Benadryl , and may take Zyrtec, Claritin, or Allegra) If you experience trouble breathing, this can be serious. If it is severe call 911 IMMEDIATELY. If it is mild, please call our office.  We will call to schedule your test 2-4 weeks out understanding that some insurance companies will need an authorization prior to the service being performed.   For more information and frequently asked questions, please visit our website : [email or web address]  For non-scheduling related questions, please contact the cardiac imaging nurse navigator should you have any  questions/concerns: Cardiac Imaging Nurse Navigators Direct Office Dial: [phone]   For scheduling needs, including cancellations and rescheduling, please call Brittany,  [phone].   Follow-Up: At [facility], you and your health needs are our priority.  As part of our continuing mission to provide you with exceptional heart care, our providers are all part of one team.  This team includes your primary Cardiologist (physician) and Advanced Practice Providers or APPs (Physician Assistants and Nurse Practitioners) who all work together to provide you with the care you need, when you need it.  Your next appointment:   2 month(s)  Provider:   Dorn Lesches, MD    Other Instructions:

## 2024-04-01 NOTE — Assessment & Plan Note (Signed)
 Patient relates a 1 year history of increasing dyspnea on exertion.  She did have it mild episode of commune acquired pneumonia several months ago which resolved.  She has smoked 1/4 pack a day for the last 40 years.  She also complains of occasional left shoulder pain.  I am going to get a 2D echocardiogram and a coronary CTA to further evaluate.  Of note, she did have a chest CT done for cancer screening 03/21/2024 that showed calcification in the LAD.

## 2024-04-01 NOTE — Assessment & Plan Note (Signed)
 Blood pressure is 114/72.  She is not on any antihypertensive medications.

## 2024-04-01 NOTE — Progress Notes (Signed)
 04/01/2024 Patricia Shields   1966-01-26  [id number]  Primary Physician Fernand Fredy RAMAN, MD Primary Cardiologist: Dorn JINNY Lesches MD [facility] [place] [place], [place]  HPI:  Patricia Shields is a 58 y.o. moderately overweight married Caucasian female with no children who is accompanied by her husband Patricia Shields who is a patient of mine as well.  She was referred by her PCP, Jeoffrey Pollen, NP because of shortness of breath.  She works in a clerical position.  She smokes quarter pack a day which she has for the last 40 years.  Her risk factor profile is otherwise notable for treated hyperlipidemia.  There is no family history of heart disease.  She never had a heart attack or stroke.  She does have psoriatic arthritis.  She has had increasing dyspnea over the last year with occasional left shoulder pain.  She admits to being fairly sedentary.  She did have a chest CT performed last month for cancer screening that revealed calcification in the LAD distribution.   Current Meds  Medication Sig   atenolol (TENORMIN) 25 MG tablet Take 25 mg by mouth daily.   Azelastine HCl 137 MCG/SPRAY SOLN    clobetasol cream (TEMOVATE) 0.05 %    escitalopram  (LEXAPRO ) 20 MG tablet Take 20 mg by mouth daily.   Eszopiclone  3 MG TABS Take 1 tablet (3 mg total) by mouth at bedtime as needed.   fluticasone-salmeterol (ADVAIR DISKUS) 250-50 MCG/ACT AEPB INHALE 1 PUFF INTO THE LUNGS TWICE A DAY FOR 30 DAYS   LORazepam  (ATIVAN ) 0.5 MG tablet TAKE 1/2-1 TABLET BY MOUTH EVERY 6 HOURS AS NEEDED FOR ANXIETY (Patient taking differently: every 6 (six) hours as needed for anxiety. TAKE 1/2-1 TABLET BY MOUTH EVERY 6 HOURS AS NEEDED FOR ANXIETY)   pantoprazole  (PROTONIX ) 40 MG tablet Take 1 tablet (40 mg total) by mouth daily.   simvastatin  (ZOCOR ) 40 MG tablet Take 1 tablet (40 mg total) by mouth every evening.     Allergies  Allergen Reactions   Bactrim     Codeine Other (See Comments)    Reaction: causes accelerated heart  rate    Demerol Other (See Comments)    Reaction: causes accelerated heart rate    Doxycycline  Hyclate Other (See Comments)    doxycycline  hyclate   Sulfa  Antibiotics Other (See Comments)   Meperidine Palpitations    Social History   Socioeconomic History   Marital status: Married    Spouse name: Not on file   Number of children: Not on file   Years of education: Not on file   Highest education level: Not on file  Occupational History   Not on file  Tobacco Use   Smoking status: Every Day    Current packs/day: 0.50    Average packs/day: 0.5 packs/day for 40.0 years (20.0 ttl pk-yrs)    Types: Cigarettes   Smokeless tobacco: Never   Tobacco comments:    Attempting to stop smoking  Vaping Use   Vaping status: Never Used  Substance and Sexual Activity   Alcohol use: Yes    Comment: occ   Drug use: No   Sexual activity: Yes  Other Topics Concern   Not on file  Social History Narrative   Not on file   Social Drivers of Health   Financial Resource Strain: Low Risk  (02/28/2024)   Received from [facility]   Overall Financial Resource Strain (CARDIA)    Difficulty of Paying Living Expenses: Not hard at  all  Food Insecurity: No Food Insecurity (02/28/2024)   Received from [facility]   Hunger Vital Sign    Within the past 12 months, you worried that your food would run out before you got the money to buy more.: Never true    Within the past 12 months, the food you bought just didn't last and you didn't have money to get more.: Never true  Transportation Needs: No Transportation Needs (02/28/2024)   Received from [facility]    In the past 12 months, has lack of transportation kept you from medical appointments or from getting medications?: No    Lack of Transportation (Non-Medical): No  Physical Activity: Not on file  Stress: Not on file  Social Connections: Unknown (03/07/2022)    Received from [facility]   Social Network    Social Network: Not on file  Intimate Partner Violence: Not At Risk (03/10/2023)   Humiliation, Afraid, Rape, and Kick questionnaire    Fear of Current or Ex-Partner: No    Emotionally Abused: No    Physically Abused: No    Sexually Abused: No     Review of Systems: General: negative for chills, fever, night sweats or weight changes.  Cardiovascular: negative for chest pain, dyspnea on exertion, edema, orthopnea, palpitations, paroxysmal nocturnal dyspnea or shortness of breath Dermatological: negative for rash Respiratory: negative for cough or wheezing Urologic: negative for hematuria Abdominal: negative for nausea, vomiting, diarrhea, bright red blood per rectum, melena, or hematemesis Neurologic: negative for visual changes, syncope, or dizziness All other systems reviewed and are otherwise negative except as noted above.    Blood pressure 114/72, pulse 83, height 5' 6 (1.676 m), weight 211 lb (95.7 kg), last menstrual period 02/21/2011, SpO2 98%.  General appearance: alert and no distress Neck: no adenopathy, no carotid bruit, no JVD, supple, symmetrical, trachea midline, and thyroid not enlarged, symmetric, no tenderness/mass/nodules Lungs: clear to auscultation bilaterally Heart: regular rate and rhythm, S1, S2 normal, no murmur, click, rub or gallop Extremities: extremities normal, atraumatic, no cyanosis or edema Pulses: 2+ and symmetric Skin: Skin color, texture, turgor normal. No rashes or lesions Neurologic: Grossly normal  EKG EKG Interpretation Date/Time:  Monday April 01 2024 13:45:32 EST Ventricular Rate:  83 PR Interval:  130 QRS Duration:  80 QT Interval:  372 QTC Calculation: 437 R Axis:   31  Text Interpretation: Normal sinus rhythm Possible Anterior infarct , age undetermined When compared with ECG of 05-Mar-2010 07:36, Nonspecific T wave abnormality, improved in Inferior leads Nonspecific T wave  abnormality now evident in Anterior leads Confirmed by Court Carrier [phone]) on 04/01/2024 1:52:01 PM    ASSESSMENT AND PLAN:   Essential hypertension Blood pressure is 114/72.  She is not on any antihypertensive medications.  Hyperlipidemia History of hyperlipidemia on simvastatin  which was just uptitrated by her PCP.  Her most recent lipid profile performed 03/04/2024 revealed total cholesterol 203, LDL 109 and HDL 56.  Shortness of breath Patient relates a 1 year history of increasing dyspnea on exertion.  She did have it mild episode of commune acquired pneumonia several months ago which resolved.  She has smoked 1/4 pack a day for the last 40 years.  She also complains of occasional left shoulder pain.  I am going to get a 2D echocardiogram and a coronary CTA to further evaluate.  Of note, she did have a chest CT done for cancer screening 03/21/2024 that showed calcification in  the LAD.     Dorn DOROTHA Lesches MD FACP,FACC,FAHA, [facility] 04/01/2024 2:05 PM

## 2024-04-01 NOTE — Assessment & Plan Note (Signed)
 History of hyperlipidemia on simvastatin  which was just uptitrated by her PCP.  Her most recent lipid profile performed 03/04/2024 revealed total cholesterol 203, LDL 109 and HDL 56.

## 2024-04-05 ENCOUNTER — Ambulatory Visit: Admitting: Internal Medicine

## 2024-04-08 ENCOUNTER — Encounter (HOSPITAL_COMMUNITY): Payer: Self-pay

## 2024-04-11 ENCOUNTER — Ambulatory Visit (HOSPITAL_COMMUNITY)
Admission: RE | Admit: 2024-04-11 | Discharge: 2024-04-11 | Disposition: A | Discharge status: Home / Self Care | Source: Ambulatory Visit | Attending: Cardiovascular Disease | Admitting: Cardiovascular Disease

## 2024-04-11 ENCOUNTER — Encounter: Payer: Self-pay | Admitting: Cardiovascular Disease

## 2024-04-11 DIAGNOSIS — R072 Precordial pain: Secondary | ICD-10-CM | POA: Insufficient documentation

## 2024-04-11 MED ORDER — NITROGLYCERIN 0.4 MG SL SUBL
0.8000 mg | SUBLINGUAL_TABLET | Freq: Once | SUBLINGUAL | Status: AC
  Administered 2024-04-11: 0.8 mg via SUBLINGUAL

## 2024-04-11 MED ORDER — IOHEXOL 350 MG/ML SOLN
100.0000 mL | Freq: Once | INTRAVENOUS | Status: AC | PRN
  Administered 2024-04-11: 100 mL via INTRAVENOUS

## 2024-04-12 ENCOUNTER — Encounter: Payer: Self-pay | Admitting: Cardiovascular Disease

## 2024-04-12 ENCOUNTER — Ambulatory Visit: Payer: Self-pay | Admitting: Cardiovascular Disease

## 2024-04-12 DIAGNOSIS — E785 Hyperlipidemia, unspecified: Secondary | ICD-10-CM

## 2024-04-12 LAB — HM MAMMOGRAPHY: HM Mammogram: NORMAL

## 2024-04-12 LAB — HM DEXA SCAN

## 2024-04-12 NOTE — Telephone Encounter (Signed)
Pt calling in for results

## 2024-04-15 ENCOUNTER — Ambulatory Visit

## 2024-04-15 ENCOUNTER — Encounter: Payer: Self-pay | Admitting: Gastroenterology

## 2024-04-15 ENCOUNTER — Encounter
Admission: RE | Disposition: A | Discharge status: Home / Self Care | Payer: Self-pay | Source: Home / Self Care | Attending: Gastroenterology

## 2024-04-15 ENCOUNTER — Ambulatory Visit
Admission: RE | Admit: 2024-04-15 | Discharge: 2024-04-15 | Disposition: A | Discharge status: Home / Self Care | Attending: Gastroenterology | Admitting: Gastroenterology

## 2024-04-15 DIAGNOSIS — Z860101 Personal history of adenomatous and serrated colon polyps: Secondary | ICD-10-CM

## 2024-04-15 DIAGNOSIS — K219 Gastro-esophageal reflux disease without esophagitis: Secondary | ICD-10-CM | POA: Insufficient documentation

## 2024-04-15 DIAGNOSIS — Z6833 Body mass index (BMI) 33.0-33.9, adult: Secondary | ICD-10-CM | POA: Insufficient documentation

## 2024-04-15 DIAGNOSIS — J449 Chronic obstructive pulmonary disease, unspecified: Secondary | ICD-10-CM | POA: Insufficient documentation

## 2024-04-15 DIAGNOSIS — Z1211 Encounter for screening for malignant neoplasm of colon: Secondary | ICD-10-CM | POA: Diagnosis present

## 2024-04-15 DIAGNOSIS — E66813 Obesity, class 3: Secondary | ICD-10-CM | POA: Insufficient documentation

## 2024-04-15 DIAGNOSIS — Z860100 Personal history of colon polyps, unspecified: Secondary | ICD-10-CM

## 2024-04-15 DIAGNOSIS — F17210 Nicotine dependence, cigarettes, uncomplicated: Secondary | ICD-10-CM | POA: Diagnosis not present

## 2024-04-15 DIAGNOSIS — R1013 Epigastric pain: Secondary | ICD-10-CM | POA: Insufficient documentation

## 2024-04-15 DIAGNOSIS — I10 Essential (primary) hypertension: Secondary | ICD-10-CM | POA: Diagnosis not present

## 2024-04-15 HISTORY — PX: ESOPHAGOGASTRODUODENOSCOPY: SHX5428

## 2024-04-15 HISTORY — PX: COLONOSCOPY: SHX5424

## 2024-04-15 SURGERY — COLONOSCOPY
Anesthesia: General

## 2024-04-15 MED ORDER — SODIUM CHLORIDE 0.9 % IV SOLN: INTRAVENOUS | Status: DC

## 2024-04-15 MED ORDER — LIDOCAINE HCL (CARDIAC) PF 100 MG/5ML IV SOSY
PREFILLED_SYRINGE | INTRAVENOUS | Status: DC | PRN
  Administered 2024-04-15: 50 mg via INTRAVENOUS

## 2024-04-15 MED ORDER — ROSUVASTATIN CALCIUM 40 MG PO TABS: 40.0000 mg | ORAL_TABLET | Freq: Every day | ORAL | 3 refills | Status: AC

## 2024-04-15 MED ORDER — PROPOFOL 500 MG/50ML IV EMUL
INTRAVENOUS | Status: DC | PRN
  Administered 2024-04-15: 150 mg via INTRAVENOUS
  Administered 2024-04-15: 120 ug/kg/min via INTRAVENOUS

## 2024-04-15 NOTE — Transfer of Care (Signed)
 Immediate Anesthesia Transfer of Care Note  Patient: Patricia Shields  Procedure(s) Performed: COLONOSCOPY EGD (ESOPHAGOGASTRODUODENOSCOPY)  Patient Location: PACU and Endoscopy Unit  Anesthesia Type:General  Level of Consciousness: awake  Airway & Oxygen Therapy: Patient Spontanous Breathing  Post-op Assessment: Report given to RN  Post vital signs: Reviewed and stable  Last Vitals:  Vitals Value Taken Time  BP 114/89 04/15/24 10:21  Temp 35.9 C 04/15/24 10:21  Pulse 80 04/15/24 10:21  Resp 14 04/15/24 10:21  SpO2 99 % 04/15/24 10:21  Vitals shown include unfiled device data.  Last Pain:  Vitals:   04/15/24 1021  TempSrc: Temporal  PainSc: 0-No pain         Complications: No notable events documented.

## 2024-04-15 NOTE — H&P (Signed)
 Patricia Copping, MD [facility] [address] [place], [place] [address] Phone:[phone] Fax : [phone]  Primary Care Physician:  Patricia Fredy RAMAN, MD Primary Gastroenterologist:  Dr. Copping  Pre-Procedure History & Physical: HPI:  Patricia Shields is a 58 y.o. female is here for an endoscopy and colonoscopy.   Past Medical History:  Diagnosis Date   Abscess of vulva 11/28/2022   Anxiety    Asthma    advair inhaler as needed   Carpal tunnel syndrome    hand brace prn   Cervical disc disorder with radiculopathy 10/26/2020   Chronic recurrent sinusitis 06/07/2020   Cough 02/15/2024   Disorder of vagina 04/29/2020   Disorder of vagina 04/29/2020   Fibroids    Fibroma of mouth 08/26/2021   GERD (gastroesophageal reflux disease)    occasional-no meds   Hand pain 08/26/2021   Hypercholesteremia    Hypercholesterolemia 02/09/2024   Intermittent palpitations 05/09/2020   Mean red blood cell volume increased 05/09/2020   Mean red blood cell volume increased 05/09/2020   Mixed anxiety and depressive disorder 06/07/2020   Nasal vestibulitis 06/07/2020   Nicotine dependence, unspecified, uncomplicated 02/15/2024   Osteoarthritis 08/26/2021   Ovarian cyst, left    Pernio 08/26/2021   Psoriasis 08/26/2021   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   Psoriatic arthritis (HCC)    Raynaud disease    Sinus tachycardia 05/09/2020   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, left    Subacute cough 02/15/2024   Tinnitus of both ears 08/26/2021   Tobacco user 02/09/2024   Vasomotor rhinitis 02/15/2024   Vitamin D deficiency     Past Surgical History:  Procedure Laterality Date   ANKLE FRACTURE SURGERY     APPENDECTOMY     COLONOSCOPY WITH PROPOFOL  N/A 01/04/2019   Procedure: COLONOSCOPY WITH PROPOFOL ;  Surgeon: Patricia Corinn Skiff, MD;  Location: [facility] ENDOSCOPY;  Service: Gastroenterology;  Laterality: N/A;   CYSTECTOMY     LAPAROSCOPIC HYSTERECTOMY  2012    SALPINGOOPHORECTOMY  03/17/2011   Procedure: SALPINGO OOPHERECTOMY;  Surgeon: Patricia DELENA Carder, MD;  Location: [facility] ORS;  Service: Gynecology;  Laterality: Left;   TONSILLECTOMY      Prior to Admission medications   Medication Sig Start Date End Date Taking? Authorizing Provider  atenolol (TENORMIN) 25 MG tablet Take 25 mg by mouth daily. 01/31/22  Yes [provider]  escitalopram  (LEXAPRO ) 20 MG tablet Take 20 mg by mouth daily. 03/01/24  Yes [provider]  pantoprazole  (PROTONIX ) 40 MG tablet Take 1 tablet (40 mg total) by mouth daily. 03/26/24  Yes Patricia Rima, NP  Azelastine HCl 137 MCG/SPRAY SOLN     [provider]  clobetasol cream (TEMOVATE) 0.05 %  03/18/19   [provider]  Eszopiclone  3 MG TABS Take 1 tablet (3 mg total) by mouth at bedtime as needed. 04/09/23   Patricia Shields, PMHNP  fluticasone-salmeterol (ADVAIR DISKUS) 250-50 MCG/ACT AEPB INHALE 1 PUFF INTO THE LUNGS TWICE A DAY FOR 30 DAYS    [provider]  LORazepam  (ATIVAN ) 0.5 MG tablet TAKE 1/2-1 TABLET BY MOUTH EVERY 6 HOURS AS NEEDED FOR ANXIETY Patient taking differently: every 6 (six) hours as needed for anxiety. TAKE 1/2-1 TABLET BY MOUTH EVERY 6 HOURS AS NEEDED FOR ANXIETY 04/25/23   Patricia Shields, PMHNP  metoprolol  tartrate (LOPRESSOR ) 100 MG tablet Take 2 hours prior to CT 04/01/24   Patricia Dorn PARAS, MD  rosuvastatin  (CRESTOR ) 40 MG tablet Take  1 tablet (40 mg total) by mouth daily. 04/15/24 07/14/24  Patricia Dorn PARAS, MD    Allergies as of 12/11/2023 - Review Complete 12/11/2023  Allergen Reaction Noted   Bactrim   08/20/2011   Codeine Other (See Comments) 03/02/2011   Demerol Other (See Comments) 03/02/2011   Doxycycline  hyclate Other (See Comments) 12/11/2023   Meperidine Palpitations 12/20/2018    Family History  Problem Relation Age of Onset   Dementia Mother    Raynaud syndrome Mother    Bladder Cancer Father    Diabetes Father     Multiple sclerosis Sister     Social History   Socioeconomic History   Marital status: Married    Spouse name: Not on file   Number of children: Not on file   Years of education: Not on file   Highest education level: Not on file  Occupational History   Not on file  Tobacco Use   Smoking status: Every Day    Current packs/day: 0.50    Average packs/day: 0.5 packs/day for 40.0 years (20.0 ttl pk-yrs)    Types: Cigarettes   Smokeless tobacco: Never   Tobacco comments:    Attempting to stop smoking  Vaping Use   Vaping status: Never Used  Substance and Sexual Activity   Alcohol use: Yes    Comment: occ   Drug use: No   Sexual activity: Yes  Other Topics Concern   Not on file  Social History Narrative   Not on file   Social Drivers of Health   Financial Resource Strain: Low Risk  (02/28/2024)   Received from [facility]   Overall Financial Resource Strain (CARDIA)    Difficulty of Paying Living Expenses: Not hard at all  Food Insecurity: No Food Insecurity (02/28/2024)   Received from [facility]   Hunger Vital Sign    Within the past 12 months, you worried that your food would run out before you got the money to buy more.: Never true    Within the past 12 months, the food you bought just didn't last and you didn't have money to get more.: Never true  Transportation Needs: No Transportation Needs (02/28/2024)   Received from [facility]   PRAPARE - Transportation    Lack of Transportation (Non-Medical): No    In the past 12 months, has lack of transportation kept you from medical appointments or from getting medications?: No  Physical Activity: Not on file  Stress: Not on file  Social Connections: Not on file  Intimate Partner Violence: Not At Risk (03/10/2023)   Humiliation, Afraid, Rape, and Kick questionnaire    Fear of Current or Ex-Partner: No    Emotionally Abused: No    Physically Abused: No    Sexually  Abused: No    Review of Systems: See HPI, otherwise negative ROS  Physical Exam: Ht 5' 6 (1.676 m)   Wt 93.9 kg   LMP 02/21/2011   BMI 33.41 kg/m  General:   Alert,  pleasant and cooperative in NAD Head:  Normocephalic and atraumatic. Neck:  Supple; no masses or thyromegaly. Lungs:  Clear throughout to auscultation.    Heart:  Regular rate and rhythm. Abdomen:  Soft, nontender and nondistended. Normal bowel sounds, without guarding, and without rebound.   Neurologic:  Alert and  oriented x4;  grossly normal neurologically.  Impression/Plan: Patricia Shields is here for an endoscopy and colonoscopy to be performed for GERD and a history of  adenomatous polyps on 2020   Risks, benefits, limitations, and alternatives regarding  endoscopy and colonoscopy have been reviewed with the patient.  Questions have been answered.  All parties agreeable.   Patricia Copping, MD  04/15/2024, 9:40 AM

## 2024-04-15 NOTE — Op Note (Addendum)
 [facility] Gastroenterology Patient Name: Patricia Shields Procedure Date: 04/15/2024 9:39 AM MRN: [id number] Account #: [id number] Date of Birth: 29-Jan-1966 Admit Type: Outpatient Age: 58 Room: [facility] ENDO ROOM 4 Gender: Female Note Status: Finalized Instrument Name: Colon Scope [id number] Procedure:             Colonoscopy Indications:           High risk colon cancer surveillance: Personal history                         of colonic polyps Providers:             Rogelia Copping MD, MD Referring MD:          Fredy CANDIE Bathe, MD (Referring MD) Medicines:             Propofol  per Anesthesia Complications:         No immediate complications. Procedure:             Pre-Anesthesia Assessment:                        - Prior to the procedure, a History and Physical was                         performed, and patient medications and allergies were                         reviewed. The patient's tolerance of previous                         anesthesia was also reviewed. The risks and benefits                         of the procedure and the sedation options and risks                         were discussed with the patient. All questions were                         answered, and informed consent was obtained. Prior                         Anticoagulants: The patient has taken no anticoagulant                         or antiplatelet agents. ASA Grade Assessment: II - A                         patient with mild systemic disease. After reviewing                         the risks and benefits, the patient was deemed in                         satisfactory condition to undergo the procedure.                        After obtaining informed consent, the colonoscope was  passed under direct vision. Throughout the procedure,                         the patient's blood pressure, pulse, and oxygen                         saturations were monitored continuously. The                          Colonoscope was introduced through the anus and                         advanced to the the cecum, identified by appendiceal                         orifice and ileocecal valve. The colonoscopy was                         performed without difficulty. The patient tolerated                         the procedure well. The quality of the bowel                         preparation was excellent. Findings:      The perianal and digital rectal examinations were normal.      The colon (entire examined portion) appeared normal. Impression:            - The entire examined colon is normal.                        - No specimens collected. Recommendation:        - Discharge patient to home.                        - Resume previous diet.                        - Continue present medications.                        - Repeat colonoscopy in 7 years for surveillance. Procedure Code(s):     --- Professional ---                        [phone], Colonoscopy, flexible; diagnostic, including                         collection of specimen(s) by brushing or washing, when                         performed (separate procedure) Diagnosis Code(s):     --- Professional ---                        Z86.010, Personal history of colonic polyps CPT copyright 2022 American Medical Association. All rights reserved. The codes documented in this report are preliminary and upon coder review may  be revised to meet current compliance requirements. Rogelia Copping MD, MD 04/15/2024 10:18:37 AM This report has been signed electronically. Number of  Addenda: 0 Note Initiated On: 04/15/2024 9:39 AM Scope Withdrawal Time: 0 hours 8 minutes 46 seconds  Total Procedure Duration: 0 hours 11 minutes 12 seconds  Estimated Blood Loss:  Estimated blood loss: none.      [facility]

## 2024-04-15 NOTE — Anesthesia Preprocedure Evaluation (Signed)
 Anesthesia Evaluation  Patient identified by MRN, date of birth, ID band Patient awake    Reviewed: Allergy & Precautions, NPO status , Patient's Chart, lab work & pertinent test results  Airway Mallampati: II  TM Distance: >3 FB Neck ROM: Full    Dental  (+) Teeth Intact   Pulmonary neg pulmonary ROS, COPD,  COPD inhaler, Current Smoker and Patient abstained from smoking.   Pulmonary exam normal  + decreased breath sounds      Cardiovascular Exercise Tolerance: Good hypertension, Pt. on medications negative cardio ROS Normal cardiovascular exam+ dysrhythmias  Rhythm:Regular Rate:Normal     Neuro/Psych   Anxiety     negative neurological ROS  negative psych ROS   GI/Hepatic negative GI ROS, Neg liver ROS,GERD  ,,  Endo/Other  negative endocrine ROS  Class 3 obesity  Renal/GU negative Renal ROS  negative genitourinary   Musculoskeletal   Abdominal  (+) + obese  Peds  Hematology negative hematology ROS (+)   Anesthesia Other Findings Past Medical History: 11/28/2022: Abscess of vulva No date: Anxiety No date: Asthma     Comment:  advair inhaler as needed No date: Carpal tunnel syndrome     Comment:  hand brace prn 10/26/2020: Cervical disc disorder with radiculopathy 06/07/2020: Chronic recurrent sinusitis 02/15/2024: Cough 04/29/2020: Disorder of vagina 04/29/2020: Disorder of vagina No date: Fibroids 08/26/2021: Fibroma of mouth No date: GERD (gastroesophageal reflux disease)     Comment:  occasional-no meds 08/26/2021: Hand pain No date: Hypercholesteremia 02/09/2024: Hypercholesterolemia 05/09/2020: Intermittent palpitations 05/09/2020: Mean red blood cell volume increased 05/09/2020: Mean red blood cell volume increased 06/07/2020: Mixed anxiety and depressive disorder 06/07/2020: Nasal vestibulitis 02/15/2024: Nicotine dependence, unspecified, uncomplicated 08/26/2021: Osteoarthritis No date:  Ovarian cyst, left 08/26/2021: Pernio 08/26/2021: Psoriasis 06/23/2017: Psoriasis with arthropathy (HCC) No date: Psoriatic arthritis (HCC) No date: Raynaud disease 05/09/2020: Sinus tachycardia No date: 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, left 02/15/2024: Subacute cough 08/26/2021: Tinnitus of both ears 02/09/2024: Tobacco user 02/15/2024: Vasomotor rhinitis No date: Vitamin D deficiency  Past Surgical History: No date: ANKLE FRACTURE SURGERY No date: APPENDECTOMY 01/04/2019: COLONOSCOPY WITH PROPOFOL ; N/A     Comment:  Procedure: COLONOSCOPY WITH PROPOFOL ;  Surgeon: Unk Corinn Skiff, MD;  Location: [facility] ENDOSCOPY;  Service:               Gastroenterology;  Laterality: N/A; No date: CYSTECTOMY 2012: LAPAROSCOPIC HYSTERECTOMY 03/17/2011: SALPINGOOPHORECTOMY     Comment:  Procedure: SALPINGO OOPHERECTOMY;  Surgeon: Dickie DELENA Carder, MD;  Location: [facility] ORS;  Service: Gynecology;                Laterality: Left; No date: TONSILLECTOMY  BMI    Body Mass Index: 33.41 kg/m      Reproductive/Obstetrics negative OB ROS                              Anesthesia Physical Anesthesia Plan  ASA: 3  Anesthesia Plan: General   Post-op Pain Management:    Induction: Intravenous  PONV Risk Score and Plan: Propofol  infusion and TIVA  Airway Management Planned: Natural Airway and Nasal Cannula  Additional Equipment:   Intra-op Plan:   Post-operative Plan:   Informed Consent: I have reviewed the patients History and Physical, chart, labs and discussed the  procedure including the risks, benefits and alternatives for the proposed anesthesia with the patient or authorized representative who has indicated his/her understanding and acceptance.     Dental Advisory Given  Plan Discussed with: CRNA  Anesthesia Plan Comments:         Anesthesia Quick Evaluation

## 2024-04-15 NOTE — Anesthesia Postprocedure Evaluation (Signed)
 Anesthesia Post Note  Patient: Patricia Shields  Procedure(s) Performed: COLONOSCOPY EGD (ESOPHAGOGASTRODUODENOSCOPY)  Patient location during evaluation: PACU Anesthesia Type: General Level of consciousness: awake Pain management: pain level controlled Vital Signs Assessment: post-procedure vital signs reviewed and stable Respiratory status: spontaneous breathing Cardiovascular status: blood pressure returned to baseline Anesthetic complications: no   No notable events documented.   Last Vitals:  Vitals:   04/15/24 0941 04/15/24 1021  BP: 131/79 114/89  Pulse: 64 78  Resp: 18 (!) 9  Temp: 36.4 C (!) 35.9 C  SpO2: 100% 99%    Last Pain:  Vitals:   04/15/24 1021  TempSrc: Temporal  PainSc: 0-No pain                 VAN STAVEREN,Elexius Minar

## 2024-04-15 NOTE — Op Note (Addendum)
 [facility] Gastroenterology Patient Name: Patricia Shields Procedure Date: 04/15/2024 9:57 AM MRN: [id number] Account #: [id number] Date of Birth: 05-20-1966 Admit Type: Outpatient Age: 58 Room: [facility] ENDO ROOM 4 Gender: Female Note Status: Finalized Instrument Name: Upper GI Scope [phone] Procedure:             Upper GI endoscopy Indications:           Epigastric abdominal pain, Suspected esophageal reflux Providers:             Rogelia Copping MD, MD Referring MD:          Fredy CANDIE Bathe, MD (Referring MD) Medicines:             Propofol  per Anesthesia Complications:         No immediate complications. Procedure:             Pre-Anesthesia Assessment:                        - Prior to the procedure, a History and Physical was                         performed, and patient medications and allergies were                         reviewed. The patient's tolerance of previous                         anesthesia was also reviewed. The risks and benefits                         of the procedure and the sedation options and risks                         were discussed with the patient. All questions were                         answered, and informed consent was obtained. Prior                         Anticoagulants: The patient has taken no anticoagulant                         or antiplatelet agents. ASA Grade Assessment: II - A                         patient with mild systemic disease. After reviewing                         the risks and benefits, the patient was deemed in                         satisfactory condition to undergo the procedure.                        After obtaining informed consent, the endoscope was                         passed under direct vision. Throughout the procedure,  the patient's blood pressure, pulse, and oxygen                         saturations were monitored continuously. The Endoscope                          was introduced through the mouth, and advanced to the                         second part of duodenum. The upper GI endoscopy was                         accomplished without difficulty. The patient tolerated                         the procedure well. Findings:      The examined esophagus was normal.      The stomach was normal.      The examined duodenum was normal. Impression:            - Normal esophagus.                        - Normal stomach.                        - Normal examined duodenum.                        - No specimens collected. Recommendation:        - Discharge patient to home.                        - Resume previous diet.                        - Continue present medications.                        - Perform a colonoscopy today. Procedure Code(s):     --- Professional ---                        [phone], Esophagogastroduodenoscopy, flexible,                         transoral; diagnostic, including collection of                         specimen(s) by brushing or washing, when performed                         (separate procedure) Diagnosis Code(s):     --- Professional ---                        R10.13, Epigastric pain CPT copyright 2022 American Medical Association. All rights reserved. The codes documented in this report are preliminary and upon coder review may  be revised to meet current compliance requirements. Rogelia Copping MD, MD 04/15/2024 10:04:44 AM This report has been signed electronically. Number of Addenda: 0 Note Initiated On: 04/15/2024 9:57 AM Estimated Blood Loss:  Estimated blood loss: none.  [facility]

## 2024-04-15 NOTE — Telephone Encounter (Signed)
 Court Dorn PARAS, MD to Me     04/13/24  2:59 PM LDL 109 on Simva 40. Elevated CCS. LDL goal <70. Change to Crestor  40 and re check FLP 3 months.LDL 109 on Simva 40. Elevated CCS. LDL goal <70. Change to Crestor  40 and re check FLP 3 months.

## 2024-04-18 ENCOUNTER — Encounter: Payer: Self-pay | Admitting: Cardiology

## 2024-04-18 DIAGNOSIS — I10 Essential (primary) hypertension: Secondary | ICD-10-CM

## 2024-04-22 ENCOUNTER — Ambulatory Visit: Admitting: Cardiovascular Disease

## 2024-04-22 ENCOUNTER — Ambulatory Visit: Admitting: Family Medicine

## 2024-04-22 MED ORDER — ATENOLOL 25 MG PO TABS: 25.0000 mg | ORAL_TABLET | Freq: Every day | ORAL | 3 refills | Status: AC

## 2024-04-23 ENCOUNTER — Ambulatory Visit: Attending: Cardiovascular Disease

## 2024-04-23 ENCOUNTER — Ambulatory Visit: Admitting: Family Medicine

## 2024-04-23 DIAGNOSIS — R072 Precordial pain: Secondary | ICD-10-CM

## 2024-04-23 DIAGNOSIS — R0602 Shortness of breath: Secondary | ICD-10-CM | POA: Diagnosis not present

## 2024-04-23 LAB — ECHOCARDIOGRAM COMPLETE
AR max vel: 1.78 cm2
AV Area VTI: 1.86 cm2
AV Area mean vel: 1.73 cm2
AV Mean grad: 8 mmHg
AV Peak grad: 13.4 mmHg
Ao pk vel: 1.83 m/s
Area-P 1/2: 3.77 cm2
S' Lateral: 3.24 cm

## 2024-04-30 ENCOUNTER — Ambulatory Visit: Attending: Cardiovascular Disease | Admitting: Cardiovascular Disease

## 2024-04-30 ENCOUNTER — Encounter: Payer: Self-pay | Admitting: Cardiovascular Disease

## 2024-04-30 ENCOUNTER — Other Ambulatory Visit (HOSPITAL_BASED_OUTPATIENT_CLINIC_OR_DEPARTMENT_OTHER)

## 2024-04-30 VITALS — BP 112/66 | HR 61 | Ht 65.0 in | Wt 215.0 lb

## 2024-04-30 DIAGNOSIS — R0609 Other forms of dyspnea: Secondary | ICD-10-CM

## 2024-04-30 DIAGNOSIS — E785 Hyperlipidemia, unspecified: Secondary | ICD-10-CM

## 2024-04-30 NOTE — Patient Instructions (Signed)
 Medication Instructions:  Your physician recommends that you continue on your current medications as directed. Please refer to the Current Medication list given to you today.  *If you need a refill on your cardiac medications before your next appointment, please call your pharmacy*   Follow-Up: At [facility], you and your health needs are our priority.  As part of our continuing mission to provide you with exceptional heart care, our providers are all part of one team.  This team includes your primary Cardiologist (physician) and Advanced Practice Providers or APPs (Physician Assistants and Nurse Practitioners) who all work together to provide you with the care you need, when you need it.  Your next appointment:   6 month(s)  Provider:   Dorn Lesches, MD    We recommend signing up for the patient portal called MyChart.  Sign up information is provided on this After Visit Summary.  MyChart is used to connect with patients for Virtual Visits (Telemedicine).  Patients are able to view lab/test results, encounter notes, upcoming appointments, etc.  Non-urgent messages can be sent to your provider as well.   To learn more about what you can do with MyChart, go to [email or web address].   Other Instructions We will schedule you an appointment to further discuss cholesterol therapy with clinical PharmD.   Heart-Healthy Eating Plan Eating a healthy diet is important for the health of your heart. A heart-healthy eating plan includes: Eating less unhealthy fats. Eating more healthy fats. Eating less salt in your food. Salt is also called sodium. Making other changes in your diet. Cooking Avoid frying your food. Try to bake, boil, grill, or broil it instead. You can also reduce fat by: Removing the skin from poultry. Removing all visible fats from meats. Steaming vegetables in water or broth. Meal planning  At meals, divide your plate into four equal parts: Fill one-half of  your plate with vegetables and green salads. Fill one-fourth of your plate with whole grains. Fill one-fourth of your plate with lean protein foods. Eat 2-4 cups of vegetables per day. One cup of vegetables is: 1 cup (91 g) broccoli or cauliflower florets. 2 medium carrots. 1 large bell pepper. 1 large sweet potato. 1 large tomato. 1 medium white potato. 2 cups (150 g) raw leafy greens. Eat 1-2 cups of fruit per day. One cup of fruit is: 1 small apple 1 large banana 1 cup (237 g) mixed fruit, 1 large orange,  cup (82 g) dried fruit, 1 cup (240 mL) 100% fruit juice. Eat more foods that have soluble fiber. These are apples, broccoli, carrots, beans, peas, and barley. Try to get 20-30 g of fiber per day. Eat 4-5 servings of nuts, legumes, and seeds per week: 1 serving of dried beans or legumes equals  cup (90 g) cooked. 1 serving of nuts is  oz (12 almonds, 24 pistachios, or 7 walnut halves). 1 serving of seeds equals  oz (8 g). General information Eat more home-cooked food. Eat less restaurant, buffet, and fast food. Limit or avoid alcohol. Limit foods that are high in starch and sugar. Avoid fried foods. Lose weight if you are overweight. Keep track of how much salt (sodium) you eat. This is important if you have high blood pressure. Ask your doctor to tell you more about this. Try to add vegetarian meals each week. Fats Choose healthy fats. These include olive oil and canola oil, flaxseeds, walnuts, almonds, and seeds. Eat more omega-3 fats. These include salmon, mackerel, sardines, tuna,  flaxseed oil, and ground flaxseeds. Try to eat fish at least 2 times each week. Check food labels. Avoid foods with trans fats or high amounts of saturated fat. Limit saturated fats. These are often found in animal products, such as meats, butter, and cream. These are also found in plant foods, such as palm oil, palm kernel oil, and coconut oil. Avoid foods with partially hydrogenated  oils in them. These have trans fats. Examples are stick margarine, some tub margarines, cookies, crackers, and other baked goods. What foods should I eat? Fruits All fresh, canned (in natural juice), or frozen fruits. Vegetables Fresh or frozen vegetables (raw, steamed, roasted, or grilled). Green salads. Grains Most grains. Choose whole wheat and whole grains most of the time. Rice and pasta, including brown rice and pastas made with whole wheat. Meats and other proteins Lean, well-trimmed beef, veal, pork, and lamb. Chicken and turkey without skin. All fish and shellfish. Wild duck, rabbit, pheasant, and venison. Egg whites or low-cholesterol egg substitutes. Dried beans, peas, lentils, and tofu. Seeds and most nuts. Dairy Low-fat or nonfat cheeses, including ricotta and mozzarella. Skim or 1% milk that is liquid, powdered, or evaporated. Buttermilk that is made with low-fat milk. Nonfat or low-fat yogurt. Fats and oils Non-hydrogenated (trans-free) margarines. Vegetable oils, including soybean, sesame, sunflower, olive, peanut, safflower, corn, canola, and cottonseed. Salad dressings or mayonnaise made with a vegetable oil. Beverages Mineral water. Coffee and tea. Diet carbonated beverages. Sweets and desserts Sherbet, gelatin, and fruit ice. Small amounts of dark chocolate. Limit all sweets and desserts. Seasonings and condiments All seasonings and condiments. The items listed above may not be a complete list of foods and drinks you can eat. Contact a dietitian for more options. What foods should I avoid? Fruits Canned fruit in heavy syrup. Fruit in cream or butter sauce. Fried fruit. Limit coconut. Vegetables Vegetables cooked in cheese, cream, or butter sauce. Fried vegetables. Grains Breads that are made with saturated or trans fats, oils, or whole milk. Croissants. Sweet rolls. Donuts. High-fat crackers, such as cheese crackers. Meats and other proteins Fatty meats, such as hot  dogs, ribs, sausage, bacon, rib-eye roast or steak. High-fat deli meats, such as salami and bologna. Caviar. Domestic duck and goose. Organ meats, such as liver. Dairy Cream, sour cream, cream cheese, and creamed cottage cheese. Whole-milk cheeses. Whole or 2% milk that is liquid, evaporated, or condensed. Whole buttermilk. Cream sauce or high-fat cheese sauce. Yogurt that is made from whole milk. Fats and oils Meat fat, or shortening. Cocoa butter, hydrogenated oils, palm oil, coconut oil, palm kernel oil. Solid fats and shortenings, including bacon fat, salt pork, lard, and butter. Nondairy cream substitutes. Salad dressings with cheese or sour cream. Beverages Regular sodas and juice drinks with added sugar. Sweets and desserts Frosting. Pudding. Cookies. Cakes. Pies. Milk chocolate or white chocolate. Buttered syrups. Full-fat ice cream or ice cream drinks. The items listed above may not be a complete list of foods and drinks to avoid. Contact a dietitian for more information. Summary Heart-healthy meal planning includes eating less unhealthy fats, eating more healthy fats, and making other changes in your diet. Eat a balanced diet. This includes fruits and vegetables, low-fat or nonfat dairy, lean protein, nuts and legumes, whole grains, and heart-healthy oils and fats. This information is not intended to replace advice given to you by your health care provider. Make sure you discuss any questions you have with your health care provider. Document Revised: 06/14/2021 Document Reviewed: 06/14/2021 Elsevier  Patient Education  2024 [employer].

## 2024-04-30 NOTE — Progress Notes (Signed)
 Patricia Shields returns today for follow-up of her outpatient test done in the evaluation of shortness of breath and left shoulder pain.  A coronary CTA performed 04/11/2024 revealed a coronary calcium  score of 156 mostly in the LAD with no evidence of obstructive disease.  A 2D echocardiogram revealed normal LV systolic function without valvular abnormalities.  Diastolic parameters were normal.  She is trying to stop smoking and reiterated this given her shortness of breath.  She already is on atorvastatin 40 mg a day with recent lipid profile performed 03/04/2024 revealing total cholesterol 203, LDL 109 and HDL of 56, not at goal for secondary prevention.  I am going to refer her to a Pharm.D. to discuss addition of a PCSK9, LDL goal less than 70.  We also talked about the importance of lifestyle modification and weight loss.  I offered her referral to the diet wellness center which she declined.  I also suggested that she talk to her PCP about the possibility of a GLP-1 agonist.  I will see her back in 6 months for follow-up.  Dorn DOROTHA Lesches, M.D., FACP, [facility], FAHA, [facility]  [address] [place], [place]  [address]  [phone] 04/30/2024 9:02 AM

## 2024-05-03 ENCOUNTER — Encounter: Payer: Self-pay | Admitting: Internal Medicine

## 2024-05-06 ENCOUNTER — Ambulatory Visit: Admitting: Cardiovascular Disease

## 2024-05-13 ENCOUNTER — Ambulatory Visit: Admitting: Cardiology

## 2024-05-20 ENCOUNTER — Encounter: Payer: Self-pay | Admitting: Cardiology

## 2024-05-27 ENCOUNTER — Ambulatory Visit: Admitting: Internal Medicine

## 2024-05-27 ENCOUNTER — Encounter: Payer: Self-pay | Admitting: Internal Medicine

## 2024-05-27 VITALS — BP 122/74 | HR 77 | Ht 65.0 in | Wt 218.8 lb

## 2024-05-27 DIAGNOSIS — E782 Mixed hyperlipidemia: Secondary | ICD-10-CM | POA: Diagnosis not present

## 2024-05-27 DIAGNOSIS — I10 Essential (primary) hypertension: Secondary | ICD-10-CM | POA: Diagnosis not present

## 2024-05-27 DIAGNOSIS — R5382 Chronic fatigue, unspecified: Secondary | ICD-10-CM | POA: Diagnosis not present

## 2024-05-27 DIAGNOSIS — E66812 Obesity, class 2: Secondary | ICD-10-CM | POA: Insufficient documentation

## 2024-05-27 DIAGNOSIS — D7589 Other specified diseases of blood and blood-forming organs: Secondary | ICD-10-CM

## 2024-05-27 DIAGNOSIS — M797 Fibromyalgia: Secondary | ICD-10-CM | POA: Diagnosis not present

## 2024-05-27 DIAGNOSIS — R7303 Prediabetes: Secondary | ICD-10-CM

## 2024-05-27 DIAGNOSIS — Z6836 Body mass index (BMI) 36.0-36.9, adult: Secondary | ICD-10-CM | POA: Diagnosis not present

## 2024-05-27 MED ORDER — SEMAGLUTIDE-WEIGHT MANAGEMENT 0.25 MG/0.5ML ~~LOC~~ SOAJ: 0.2500 mg | SUBCUTANEOUS | 3 refills | Status: DC

## 2024-05-27 NOTE — Progress Notes (Signed)
 "  Established Patient Office Visit  Subjective:  Patient ID: Patricia Shields, female    DOB: Mar 02, 1966  Age: 59 y.o. MRN: [id number]  Chief Complaint  Patient presents with   Follow-up    Discuss weight loss options    Patient is here today for follow up. She reports doing fairly well and is interested in weight loss medications. She reports being interested in weight loss medication as her Cardiologist recommended weight loss medications. She has elevated calcium  score and is on Cresto 40 mg daily. Her Cardiologist has discussed Repatha injections if her cholesterol remains elevated. She denies hx of heart attack or stroke. She reports no family hx or personal hx of thyroid cancer or pancreatitis. Will start Wegovy  once weekly injection. Provided patient with sample box. Will complete PA as needed. Reinforced healthy diet, drinking plenty of water daily and exercising as tolerated.  She reports she is now down to 4 cigarettes a day which is down from 6 cigarettes a day. She is up to date on her preventative screenings. She denies in shortness of breath or chest pain.      No other concerns at this time.   Past Medical History:  Diagnosis Date   Abscess of vulva 11/28/2022   Anxiety    Asthma    advair inhaler as needed   Carpal tunnel syndrome    hand brace prn   Cervical disc disorder with radiculopathy 10/26/2020   Chronic recurrent sinusitis 06/07/2020   Cough 02/15/2024   Disorder of vagina 04/29/2020   Disorder of vagina 04/29/2020   Fibroids    Fibroma of mouth 08/26/2021   GERD (gastroesophageal reflux disease)    occasional-no meds   Hand pain 08/26/2021   Hypercholesteremia    Hypercholesterolemia 02/09/2024   Intermittent palpitations 05/09/2020   Mean red blood cell volume increased 05/09/2020   Mean red blood cell volume increased 05/09/2020   Mixed anxiety and depressive disorder 06/07/2020   Nasal vestibulitis 06/07/2020   Nicotine dependence,  unspecified, uncomplicated 02/15/2024   Osteoarthritis 08/26/2021   Ovarian cyst, left    Pernio 08/26/2021   Psoriasis 08/26/2021   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   Psoriatic arthritis (HCC)    Raynaud disease    Sinus tachycardia 05/09/2020   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, left    Subacute cough 02/15/2024   Tinnitus of both ears 08/26/2021   Tobacco user 02/09/2024   Vasomotor rhinitis 02/15/2024   Vitamin D deficiency     Past Surgical History:  Procedure Laterality Date   ANKLE FRACTURE SURGERY     APPENDECTOMY     COLONOSCOPY N/A 04/15/2024   Procedure: COLONOSCOPY;  Surgeon: Jinny Carmine, MD;  Location: [facility] ENDOSCOPY;  Service: Endoscopy;  Laterality: N/A;   COLONOSCOPY WITH PROPOFOL  N/A 01/04/2019   Procedure: COLONOSCOPY WITH PROPOFOL ;  Surgeon: Unk Corinn Skiff, MD;  Location: [facility] ENDOSCOPY;  Service: Gastroenterology;  Laterality: N/A;   CYSTECTOMY     ESOPHAGOGASTRODUODENOSCOPY N/A 04/15/2024   Procedure: EGD (ESOPHAGOGASTRODUODENOSCOPY);  Surgeon: Jinny Carmine, MD;  Location: [facility] ENDOSCOPY;  Service: Endoscopy;  Laterality: N/A;   LAPAROSCOPIC HYSTERECTOMY  2012   SALPINGOOPHORECTOMY  03/17/2011   Procedure: SALPINGO OOPHERECTOMY;  Surgeon: Dickie DELENA Carder, MD;  Location: [facility] ORS;  Service: Gynecology;  Laterality: Left;   TONSILLECTOMY      Social History   Socioeconomic History   Marital status: Married    Spouse name: Not on file   Number of children: Not  on file   Years of education: Not on file   Highest education level: Not on file  Occupational History   Not on file  Tobacco Use   Smoking status: Every Day    Current packs/day: 0.50    Average packs/day: 0.5 packs/day for 40.0 years (20.0 ttl pk-yrs)    Types: Cigarettes   Smokeless tobacco: Never   Tobacco comments:    Attempting to stop smoking  Vaping Use   Vaping status: Never Used  Substance and Sexual Activity   Alcohol use: Yes    Comment: occ    Drug use: No   Sexual activity: Yes  Other Topics Concern   Not on file  Social History Narrative   Not on file   Social Drivers of Health   Tobacco Use: High Risk (05/27/2024)   Patient History    Smoking Tobacco Use: Every Day    Smokeless Tobacco Use: Never    Passive Exposure: Not on file  Financial Resource Strain: Low Risk  (02/28/2024)   Received from [facility]   Overall Financial Resource Strain (CARDIA)    Difficulty of Paying Living Expenses: Not hard at all  Food Insecurity: No Food Insecurity (02/28/2024)   Received from [facility]   Epic    Within the past 12 months, you worried that your food would run out before you got the money to buy more.: Never true    Within the past 12 months, the food you bought just didn't last and you didn't have money to get more.: Never true  Transportation Needs: No Transportation Needs (02/28/2024)   Received from [facility]   PRAPARE - Transportation    Lack of Transportation (Non-Medical): No    In the past 12 months, has lack of transportation kept you from medical appointments or from getting medications?: No  Physical Activity: Not on file  Stress: Not on file  Social Connections: Not on file  Intimate Partner Violence: Not At Risk (03/10/2023)   Humiliation, Afraid, Rape, and Kick questionnaire    Fear of Current or Ex-Partner: No    Emotionally Abused: No    Physically Abused: No    Sexually Abused: No  Depression (PHQ2-9): Low Risk (03/10/2023)   Depression (PHQ2-9)    PHQ-2 Score: 0  Alcohol Screen: Not on file  Housing: Low Risk  (02/28/2024)   Received from [facility]   Epic    At any time in the past 12 months, were you homeless or living in a shelter (including now)?: No    In the past 12 months, how many times have you moved where you were living?: 0    In the last 12 months, was there a time when you were not able to pay the mortgage or  rent on time?: No  Utilities: Not At Risk (02/28/2024)   Received from [facility]   Epic    In the past 12 months has the electric, gas, oil, or water company threatened to shut off services in your home?: No  Health Literacy: Not on file    Family History  Problem Relation Age of Onset   Dementia Mother    Raynaud syndrome Mother    Bladder Cancer Father    Diabetes Father    Multiple sclerosis Sister     Allergies[1]  Show/hide medication list[2]  Review of Systems  Constitutional:  Positive for malaise/fatigue. Negative for chills and fever.  HENT: Negative.  Negative for congestion and sore throat.   Eyes: Negative.  Negative for blurred vision and pain.  Respiratory: Negative.  Negative for cough and shortness of breath.   Cardiovascular: Negative.  Negative for chest pain, palpitations and leg swelling.  Gastrointestinal: Negative.  Negative for abdominal pain, blood in stool, constipation, diarrhea, heartburn, melena, nausea and vomiting.  Genitourinary: Negative.  Negative for dysuria, flank pain, frequency and urgency.  Musculoskeletal: Negative.  Negative for joint pain and myalgias.  Skin: Negative.   Neurological: Negative.  Negative for dizziness, tingling, sensory change, weakness and headaches.  Endo/Heme/Allergies: Negative.   Psychiatric/Behavioral: Negative.  Negative for depression and suicidal ideas. The patient is not nervous/anxious.        Objective:   BP 122/74   Pulse 77   Ht 5' 5 (1.651 m)   Wt 218 lb 12.8 oz (99.2 kg)   LMP 02/21/2011   SpO2 99%   BMI 36.41 kg/m   Vitals:   05/27/24 1533  BP: 122/74  Pulse: 77  Height: 5' 5 (1.651 m)  Weight: 218 lb 12.8 oz (99.2 kg)  SpO2: 99%  BMI (Calculated): 36.41    Physical Exam Vitals and nursing note reviewed.  Constitutional:      Appearance: Normal appearance.  HENT:     Head: Normocephalic and atraumatic.     Nose: Nose normal.     Mouth/Throat:     Mouth:  Mucous membranes are moist.     Pharynx: Oropharynx is clear.  Eyes:     Conjunctiva/sclera: Conjunctivae normal.     Pupils: Pupils are equal, round, and reactive to light.  Cardiovascular:     Rate and Rhythm: Normal rate and regular rhythm.     Pulses: Normal pulses.     Heart sounds: Normal heart sounds. No murmur heard. Pulmonary:     Effort: Pulmonary effort is normal.     Breath sounds: Normal breath sounds. No wheezing.  Abdominal:     General: Bowel sounds are normal.     Palpations: Abdomen is soft.     Tenderness: There is no abdominal tenderness. There is no right CVA tenderness or left CVA tenderness.  Musculoskeletal:        General: Normal range of motion.     Cervical back: Normal range of motion.     Right lower leg: No edema.     Left lower leg: No edema.  Skin:    General: Skin is warm and dry.  Neurological:     General: No focal deficit present.     Mental Status: She is alert and oriented to person, place, and time.  Psychiatric:        Mood and Affect: Mood normal.        Behavior: Behavior normal.      Results for orders placed or performed in visit on 05/27/24  HM MAMMOGRAPHY  Result Value Ref Range   HM Mammogram Self Reported Normal 0-4 Bi-Rad, Self Reported Normal  HM DEXA SCAN  Result Value Ref Range   HM Dexa Scan osteopenia     Recent Results (from the past 2160 hours)  Vitamin B12     Status: None   Collection Time: 03/04/24  1:56 PM  Result Value Ref Range   Vitamin B-12 605 232 - 1,245 pg/mL  Lipid Panel w/o Chol/HDL Ratio     Status: Abnormal   Collection Time: 03/04/24  1:56 PM  Result Value Ref Range   Cholesterol, Total 203 (  H) 100 - 199 mg/dL   Triglycerides 775 (H) 0 - 149 mg/dL   HDL 56 >60 mg/dL   VLDL Cholesterol Cal 38 5 - 40 mg/dL   LDL Chol Calc (NIH) 890 (H) 0 - 99 mg/dL  Hemoglobin J8r     Status: None   Collection Time: 03/04/24  1:56 PM  Result Value Ref Range   Hgb A1c MFr Bld 5.5 4.8 - 5.6 %    Comment:           Prediabetes: 5.7 - 6.4          Diabetes: >6.4          Glycemic control for adults with diabetes: <7.0    Est. average glucose Bld gHb Est-mCnc 111 mg/dL  CBC with Diff     Status: Abnormal   Collection Time: 03/04/24  1:56 PM  Result Value Ref Range   WBC 8.0 3.4 - 10.8 x10E3/uL   RBC 4.03 3.77 - 5.28 x10E6/uL   Hemoglobin 13.8 11.1 - 15.9 g/dL   Hematocrit 58.3 65.9 - 46.6 %   MCV 103 (H) 79 - 97 fL   MCH 34.2 (H) 26.6 - 33.0 pg   MCHC 33.2 31.5 - 35.7 g/dL   RDW 88.0 88.2 - 84.5 %   Platelets 243 150 - 450 x10E3/uL   Neutrophils 56 Not Estab. %   Lymphs 32 Not Estab. %   Monocytes 7 Not Estab. %   Eos 4 Not Estab. %   Basos 1 Not Estab. %   Neutrophils Absolute 4.5 1.4 - 7.0 x10E3/uL   Lymphocytes Absolute 2.6 0.7 - 3.1 x10E3/uL   Monocytes Absolute 0.5 0.1 - 0.9 x10E3/uL   EOS (ABSOLUTE) 0.3 0.0 - 0.4 x10E3/uL   Basophils Absolute 0.1 0.0 - 0.2 x10E3/uL   Immature Granulocytes 0 Not Estab. %   Immature Grans (Abs) 0.0 0.0 - 0.1 x10E3/uL  CMP14+EGFR     Status: None   Collection Time: 03/04/24  1:56 PM  Result Value Ref Range   Glucose 82 70 - 99 mg/dL   BUN 14 6 - 24 mg/dL   Creatinine, Ser 9.27 0.57 - 1.00 mg/dL   eGFR 97 >40 fO/fpw/8.26   BUN/Creatinine Ratio 19 9 - 23   Sodium 141 134 - 144 mmol/L   Potassium 4.2 3.5 - 5.2 mmol/L   Chloride 102 96 - 106 mmol/L   CO2 24 20 - 29 mmol/L   Calcium  10.1 8.7 - 10.2 mg/dL   Total Protein 6.9 6.0 - 8.5 g/dL   Albumin 4.7 3.8 - 4.9 g/dL   Globulin, Total 2.2 1.5 - 4.5 g/dL   Bilirubin Total 0.5 0.0 - 1.2 mg/dL   Alkaline Phosphatase 82 49 - 135 IU/L   AST 14 0 - 40 IU/L   ALT 14 0 - 32 IU/L  HM MAMMOGRAPHY     Status: None   Collection Time: 04/12/24 12:00 AM  Result Value Ref Range   HM Mammogram Self Reported Normal 0-4 Bi-Rad, Self Reported Normal  HM DEXA SCAN     Status: None   Collection Time: 04/12/24 12:00 AM  Result Value Ref Range   HM Dexa Scan osteopenia   ECHOCARDIOGRAM COMPLETE     Status:  None   Collection Time: 04/23/24  2:39 PM  Result Value Ref Range   AR max vel 1.78 cm2   AV Peak grad 13.4 mmHg   Ao pk vel 1.83 m/s   S' Lateral 3.24 cm  Area-P 1/2 3.77 cm2   AV Area VTI 1.86 cm2   AV Mean grad 8.0 mmHg   AV Area mean vel 1.73 cm2   Est EF 55 - 60%       Assessment & Plan:  Start Wegovy  once weekly injection. Continue other medications as prescribed. Patient will return for fasting blood work and FU with her on the results. Reinforced healthy diet and exercise as tolerated. Problem List Items Addressed This Visit       Cardiovascular and Mediastinum   Essential hypertension - Primary   Relevant Medications   semaglutide -weight management (WEGOVY ) 0.25 MG/0.5ML SOAJ SQ injection   Other Relevant Orders   CMP14+EGFR   CBC with Diff     Musculoskeletal and Integument   Fibromyalgia, primary   Relevant Orders   CBC with Diff     Other   Hyperlipidemia   Relevant Medications   semaglutide -weight management (WEGOVY ) 0.25 MG/0.5ML SOAJ SQ injection   Other Relevant Orders   Lipid Panel w/o Chol/HDL Ratio   Macrocytosis   Relevant Orders   Vitamin B12   CBC with Diff   Prediabetes   Relevant Orders   Hemoglobin A1c   Class 2 severe obesity due to excess calories with serious comorbidity and body mass index (BMI) of 36.0 to 36.9 in adult   Relevant Medications   semaglutide -weight management (WEGOVY ) 0.25 MG/0.5ML SOAJ SQ injection   Other Relevant Orders   TSH   Cortisol   Chronic fatigue   Relevant Orders   TSH   Vitamin D (25 hydroxy)   Cortisol    Return in about 1 month (around 06/27/2024).   Total time spent: 25 minutes. This time includes review of previous notes and results and patient face to face interaction during today's visit.    FERNAND FREDY RAMAN, MD  05/27/2024   This document may have been prepared by [facility] Voice Recognition software and as such may include unintentional dictation errors.     [1]  Allergies Allergen  Reactions   Bactrim     Codeine Other (See Comments)    Reaction: causes accelerated heart rate    Demerol Other (See Comments)    Reaction: causes accelerated heart rate    Doxycycline  Hyclate Other (See Comments)    doxycycline  hyclate   Sulfa  Antibiotics Other (See Comments)   Meperidine Palpitations  [2]  Outpatient Medications Prior to Visit  Medication Sig   atenolol  (TENORMIN ) 25 MG tablet Take 1 tablet (25 mg total) by mouth daily.   Azelastine HCl 137 MCG/SPRAY SOLN    clobetasol cream (TEMOVATE) 0.05 %    escitalopram  (LEXAPRO ) 20 MG tablet Take 20 mg by mouth daily.   Eszopiclone  3 MG TABS Take 1 tablet (3 mg total) by mouth at bedtime as needed.   fluticasone-salmeterol (ADVAIR DISKUS) 250-50 MCG/ACT AEPB INHALE 1 PUFF INTO THE LUNGS TWICE A DAY FOR 30 DAYS   LORazepam  (ATIVAN ) 0.5 MG tablet TAKE 1/2-1 TABLET BY MOUTH EVERY 6 HOURS AS NEEDED FOR ANXIETY (Patient taking differently: every 6 (six) hours as needed for anxiety. TAKE 1/2-1 TABLET BY MOUTH EVERY 6 HOURS AS NEEDED FOR ANXIETY)   pantoprazole  (PROTONIX ) 40 MG tablet Take 1 tablet (40 mg total) by mouth daily.   rosuvastatin  (CRESTOR ) 40 MG tablet Take 1 tablet (40 mg total) by mouth daily.   metoprolol  tartrate (LOPRESSOR ) 100 MG tablet Take 2 hours prior to CT   No facility-administered medications prior to visit.   "

## 2024-05-28 ENCOUNTER — Encounter: Payer: Self-pay | Admitting: Internal Medicine

## 2024-05-29 ENCOUNTER — Encounter: Payer: Self-pay | Admitting: Internal Medicine

## 2024-05-30 ENCOUNTER — Other Ambulatory Visit

## 2024-05-30 DIAGNOSIS — R7303 Prediabetes: Secondary | ICD-10-CM

## 2024-05-30 DIAGNOSIS — R5382 Chronic fatigue, unspecified: Secondary | ICD-10-CM

## 2024-05-30 DIAGNOSIS — Z6836 Body mass index (BMI) 36.0-36.9, adult: Secondary | ICD-10-CM

## 2024-05-30 DIAGNOSIS — D7589 Other specified diseases of blood and blood-forming organs: Secondary | ICD-10-CM

## 2024-05-30 DIAGNOSIS — E782 Mixed hyperlipidemia: Secondary | ICD-10-CM

## 2024-05-30 DIAGNOSIS — I10 Essential (primary) hypertension: Secondary | ICD-10-CM

## 2024-05-30 DIAGNOSIS — M797 Fibromyalgia: Secondary | ICD-10-CM

## 2024-05-31 ENCOUNTER — Ambulatory Visit: Payer: Self-pay | Admitting: Internal Medicine

## 2024-05-31 ENCOUNTER — Telehealth: Payer: Self-pay

## 2024-05-31 ENCOUNTER — Encounter: Payer: Self-pay | Admitting: Internal Medicine

## 2024-05-31 ENCOUNTER — Encounter: Payer: Self-pay | Admitting: Cardiovascular Disease

## 2024-05-31 DIAGNOSIS — R5382 Chronic fatigue, unspecified: Secondary | ICD-10-CM

## 2024-05-31 DIAGNOSIS — Z6836 Body mass index (BMI) 36.0-36.9, adult: Secondary | ICD-10-CM

## 2024-05-31 LAB — CBC WITH DIFFERENTIAL/PLATELET
Basophils Absolute: 0.1 x10E3/uL (ref 0.0–0.2)
Basos: 1 %
EOS (ABSOLUTE): 0.2 x10E3/uL (ref 0.0–0.4)
Eos: 3 %
Hematocrit: 39.5 % (ref 34.0–46.6)
Hemoglobin: 13.3 g/dL (ref 11.1–15.9)
Immature Grans (Abs): 0 x10E3/uL (ref 0.0–0.1)
Immature Granulocytes: 0 %
Lymphocytes Absolute: 2.2 x10E3/uL (ref 0.7–3.1)
Lymphs: 26 %
MCH: 34 pg — ABNORMAL HIGH (ref 26.6–33.0)
MCHC: 33.7 g/dL (ref 31.5–35.7)
MCV: 101 fL — ABNORMAL HIGH (ref 79–97)
Monocytes Absolute: 0.4 x10E3/uL (ref 0.1–0.9)
Monocytes: 5 %
Neutrophils Absolute: 5.6 x10E3/uL (ref 1.4–7.0)
Neutrophils: 65 %
Platelets: 222 x10E3/uL (ref 150–450)
RBC: 3.91 x10E6/uL (ref 3.77–5.28)
RDW: 11.8 % (ref 11.7–15.4)
WBC: 8.4 x10E3/uL (ref 3.4–10.8)

## 2024-05-31 LAB — CMP14+EGFR
ALT: 15 IU/L (ref 0–32)
AST: 14 IU/L (ref 0–40)
Albumin: 4.7 g/dL (ref 3.8–4.9)
Alkaline Phosphatase: 70 IU/L (ref 49–135)
BUN/Creatinine Ratio: 20 (ref 9–23)
BUN: 13 mg/dL (ref 6–24)
Bilirubin Total: 0.7 mg/dL (ref 0.0–1.2)
CO2: 22 mmol/L (ref 20–29)
Calcium: 9.3 mg/dL (ref 8.7–10.2)
Chloride: 104 mmol/L (ref 96–106)
Creatinine, Ser: 0.64 mg/dL (ref 0.57–1.00)
Globulin, Total: 1.8 g/dL (ref 1.5–4.5)
Glucose: 106 mg/dL — ABNORMAL HIGH (ref 70–99)
Potassium: 3.6 mmol/L (ref 3.5–5.2)
Sodium: 141 mmol/L (ref 134–144)
Total Protein: 6.5 g/dL (ref 6.0–8.5)
eGFR: 102 mL/min/1.73

## 2024-05-31 LAB — HEMOGLOBIN A1C
Est. average glucose Bld gHb Est-mCnc: 114 mg/dL
Hgb A1c MFr Bld: 5.6 % (ref 4.8–5.6)

## 2024-05-31 LAB — LIPID PANEL W/O CHOL/HDL RATIO
Cholesterol, Total: 142 mg/dL (ref 100–199)
HDL: 51 mg/dL
LDL Chol Calc (NIH): 67 mg/dL (ref 0–99)
Triglycerides: 137 mg/dL (ref 0–149)
VLDL Cholesterol Cal: 24 mg/dL (ref 5–40)

## 2024-05-31 LAB — CORTISOL: Cortisol: 4.8 ug/dL — ABNORMAL LOW (ref 6.2–19.4)

## 2024-05-31 LAB — VITAMIN B12: Vitamin B-12: 555 pg/mL (ref 232–1245)

## 2024-05-31 LAB — TSH: TSH: 1.87 u[IU]/mL (ref 0.450–4.500)

## 2024-05-31 LAB — VITAMIN D 25 HYDROXY (VIT D DEFICIENCY, FRACTURES): Vit D, 25-Hydroxy: 27 ng/mL — ABNORMAL LOW (ref 30.0–100.0)

## 2024-06-03 ENCOUNTER — Other Ambulatory Visit: Payer: Self-pay

## 2024-06-04 ENCOUNTER — Ambulatory Visit: Admitting: Family Medicine

## 2024-06-05 ENCOUNTER — Other Ambulatory Visit

## 2024-06-05 DIAGNOSIS — R5382 Chronic fatigue, unspecified: Secondary | ICD-10-CM

## 2024-06-05 DIAGNOSIS — Z6836 Body mass index (BMI) 36.0-36.9, adult: Secondary | ICD-10-CM

## 2024-06-06 ENCOUNTER — Ambulatory Visit: Payer: Self-pay | Admitting: Internal Medicine

## 2024-06-06 LAB — CORTISOL: Cortisol: 7.5 ug/dL (ref 6.2–19.4)

## 2024-06-07 ENCOUNTER — Other Ambulatory Visit: Payer: Self-pay | Admitting: Internal Medicine

## 2024-06-07 DIAGNOSIS — Z6836 Body mass index (BMI) 36.0-36.9, adult: Secondary | ICD-10-CM

## 2024-06-07 DIAGNOSIS — I10 Essential (primary) hypertension: Secondary | ICD-10-CM

## 2024-06-07 DIAGNOSIS — E782 Mixed hyperlipidemia: Secondary | ICD-10-CM

## 2024-06-07 MED ORDER — SEMAGLUTIDE-WEIGHT MANAGEMENT 0.25 MG/0.5ML ~~LOC~~ SOAJ: 0.2500 mg | SUBCUTANEOUS | 3 refills | Status: AC

## 2024-06-07 NOTE — Progress Notes (Signed)
 Patient notified.

## 2024-06-10 ENCOUNTER — Ambulatory Visit: Admitting: Pharmacist Clinician (PhC)/ Clinical Pharmacy Specialist

## 2024-06-27 ENCOUNTER — Ambulatory Visit: Admitting: Internal Medicine

## 2024-07-17 ENCOUNTER — Ambulatory Visit: Admitting: Family Medicine

## 2024-07-19 ENCOUNTER — Ambulatory Visit: Admitting: Internal Medicine
